# Patient Record
Sex: Female | Born: 1970 | Race: White | Hispanic: No | Marital: Single | Smoking: Current every day smoker
Health system: Southern US, Community
[De-identification: ages and names within clinical notes are randomized; demographics above are authoritative.]

## PROBLEM LIST (undated history)

## (undated) DIAGNOSIS — F419 Anxiety disorder, unspecified: Secondary | ICD-10-CM

## (undated) DIAGNOSIS — N200 Calculus of kidney: Secondary | ICD-10-CM

## (undated) HISTORY — PX: TUBAL LIGATION: SHX77

## (undated) HISTORY — PX: TONSILLECTOMY: SUR1361

---

## 1998-03-24 ENCOUNTER — Encounter: Admission: RE | Admit: 1998-03-24 | Discharge: 1998-06-22 | Payer: Self-pay | Admitting: Gynecology

## 1998-07-14 ENCOUNTER — Encounter: Admission: RE | Admit: 1998-07-14 | Discharge: 1998-10-12 | Payer: Self-pay | Admitting: Gynecology

## 1998-08-26 ENCOUNTER — Inpatient Hospital Stay (HOSPITAL_COMMUNITY): Admission: AD | Admit: 1998-08-26 | Discharge: 1998-08-29 | Payer: Self-pay | Admitting: Gynecology

## 1998-09-24 ENCOUNTER — Other Ambulatory Visit: Admission: RE | Admit: 1998-09-24 | Discharge: 1998-09-24 | Payer: Self-pay | Admitting: Obstetrics and Gynecology

## 1998-10-08 ENCOUNTER — Ambulatory Visit (HOSPITAL_COMMUNITY): Admission: RE | Admit: 1998-10-08 | Discharge: 1998-10-08 | Payer: Self-pay | Admitting: Obstetrics and Gynecology

## 2000-04-20 ENCOUNTER — Emergency Department (HOSPITAL_COMMUNITY): Admission: EM | Admit: 2000-04-20 | Discharge: 2000-04-20 | Payer: Self-pay | Admitting: Emergency Medicine

## 2002-02-03 ENCOUNTER — Encounter: Payer: Self-pay | Admitting: *Deleted

## 2002-02-03 ENCOUNTER — Ambulatory Visit (HOSPITAL_COMMUNITY): Admission: RE | Admit: 2002-02-03 | Discharge: 2002-02-03 | Payer: Self-pay | Admitting: *Deleted

## 2005-08-15 ENCOUNTER — Other Ambulatory Visit: Admission: RE | Admit: 2005-08-15 | Discharge: 2005-08-15 | Payer: Self-pay | Admitting: Family Medicine

## 2006-06-03 ENCOUNTER — Emergency Department (HOSPITAL_COMMUNITY): Admission: EM | Admit: 2006-06-03 | Discharge: 2006-06-03 | Payer: Self-pay | Admitting: Emergency Medicine

## 2008-02-07 ENCOUNTER — Other Ambulatory Visit: Admission: RE | Admit: 2008-02-07 | Discharge: 2008-02-07 | Payer: Self-pay | Admitting: Family Medicine

## 2008-08-19 ENCOUNTER — Emergency Department (HOSPITAL_COMMUNITY): Admission: EM | Admit: 2008-08-19 | Discharge: 2008-08-19 | Payer: Self-pay | Admitting: Emergency Medicine

## 2009-11-22 IMAGING — CT CT ABDOMEN W/O CM
2 of 4 series · 17 of 46 positions shown, 19 images · non-contrast
Comparison: Report of prior study 02/03/2002 is reviewed.  Images
are not available for comparison.

CT ABDOMEN

CLINICAL DATA: Right-sided flank pain

CT ABDOMEN AND PELVIS WITHOUT CONTRAST
TECHNIQUE: Multidetector CT imaging of the abdomen and pelvis was
performed following the standard
protocol without intravenous contrast.

[Series 2: 160 stone 5.0 b40f st · axial · 0.58mm/px · z∈[+857,+1162]mm · 14 of 67 slices shown, 16 images]
[im 3/67  soft-tissue]
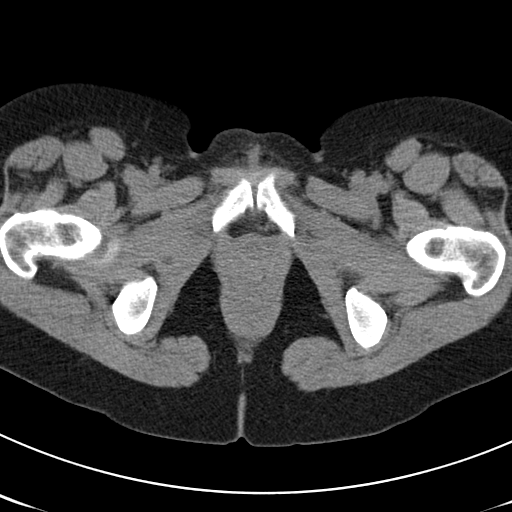
[im 3/67  bone]
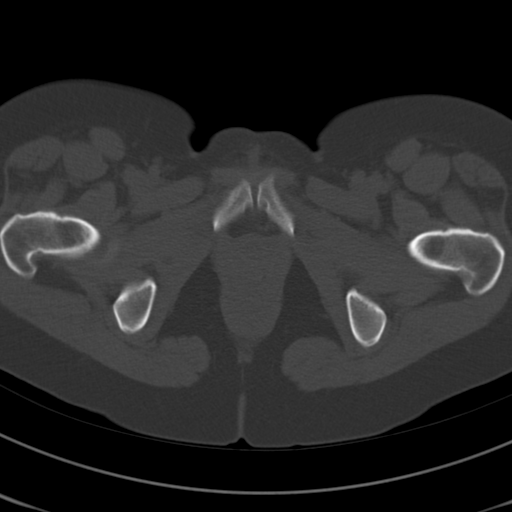
[im 8/67  soft-tissue]
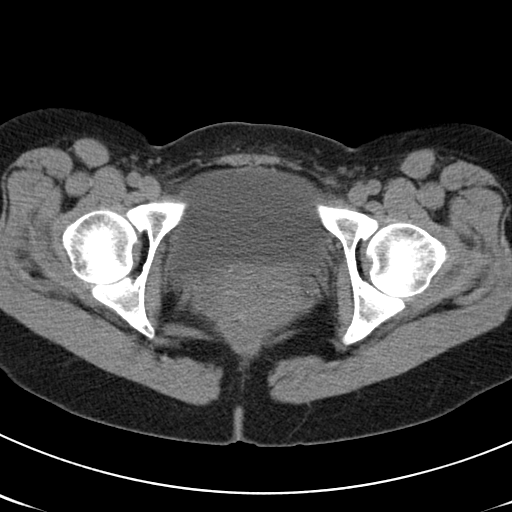
[im 12/67  soft-tissue]
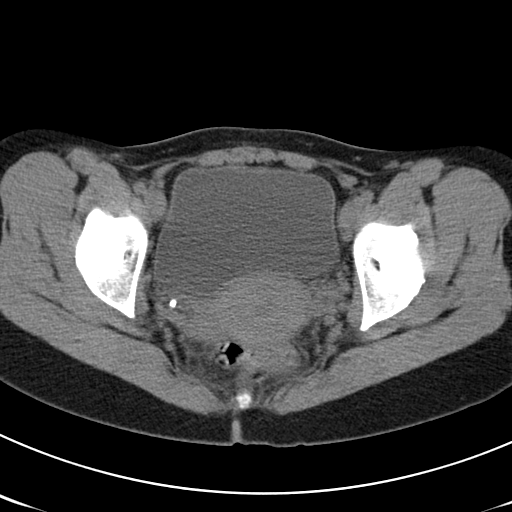
[im 17/67  soft-tissue]
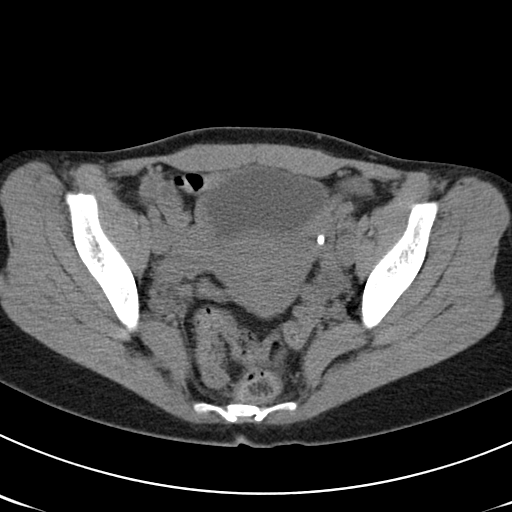
[im 22/67  soft-tissue]
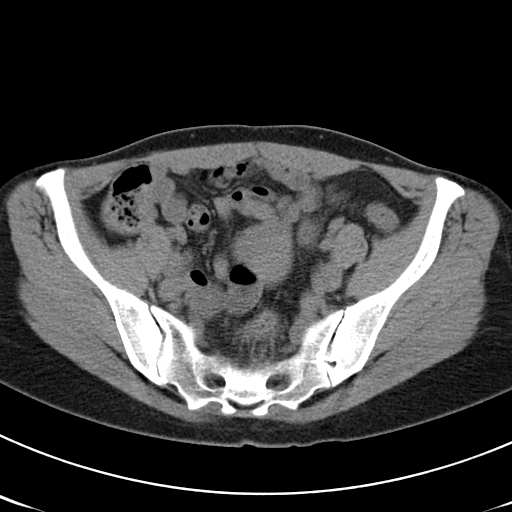
[im 26/67  soft-tissue]
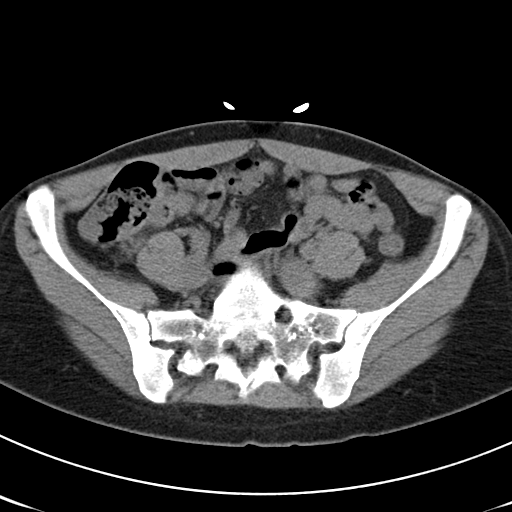
[im 31/67  soft-tissue]
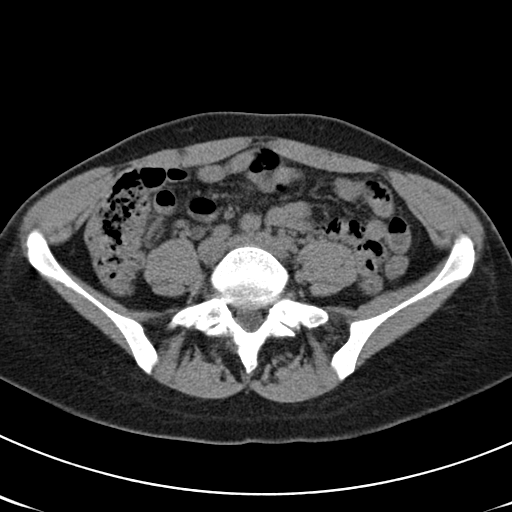
[im 36/67  soft-tissue]
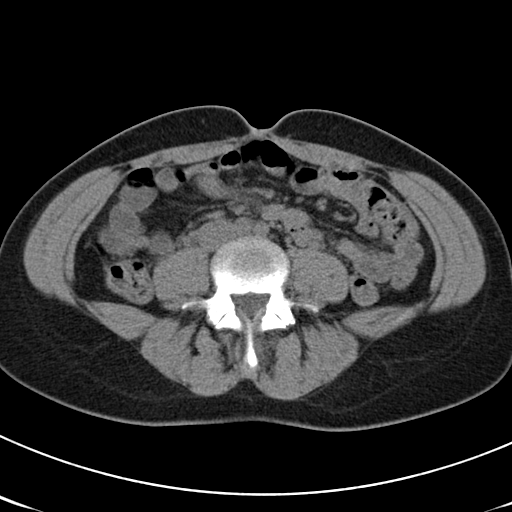
[im 41/67  soft-tissue]
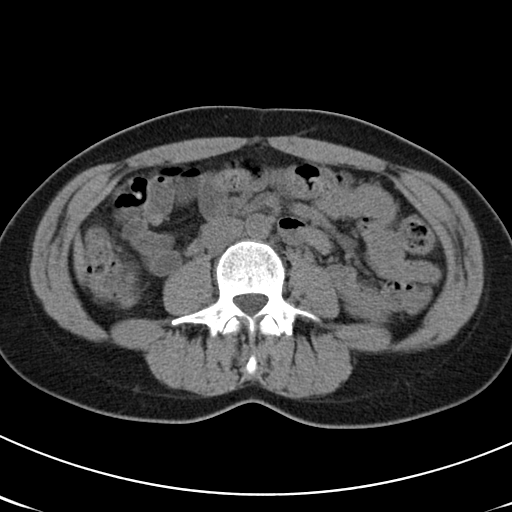
[im 41/67  bone]
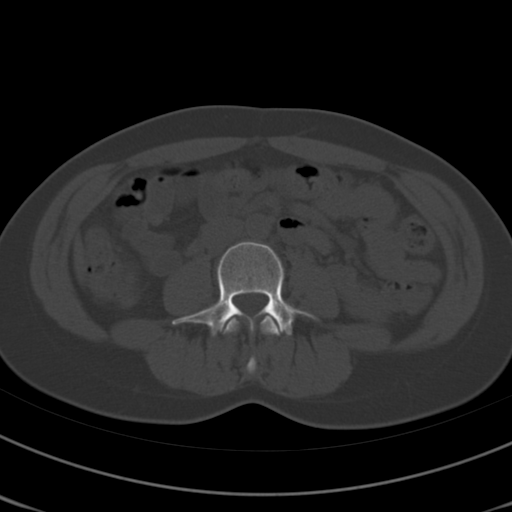
[im 45/67  soft-tissue]
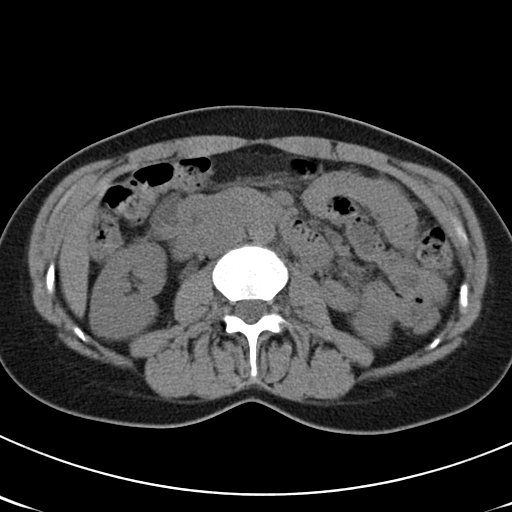
[im 50/67  soft-tissue]
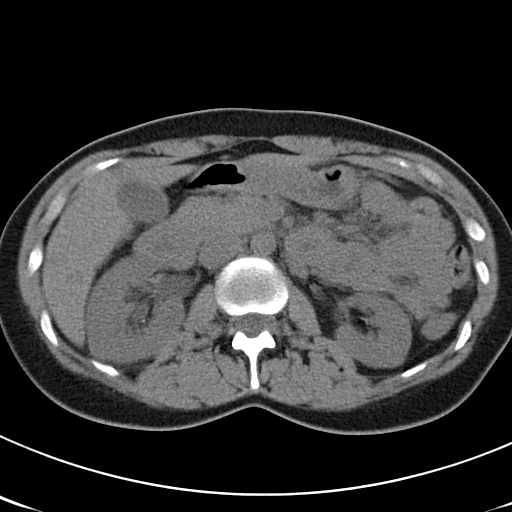
[im 55/67  soft-tissue]
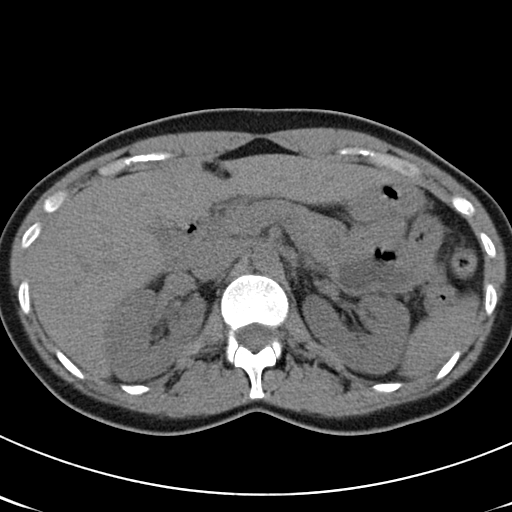
[im 59/67  soft-tissue]
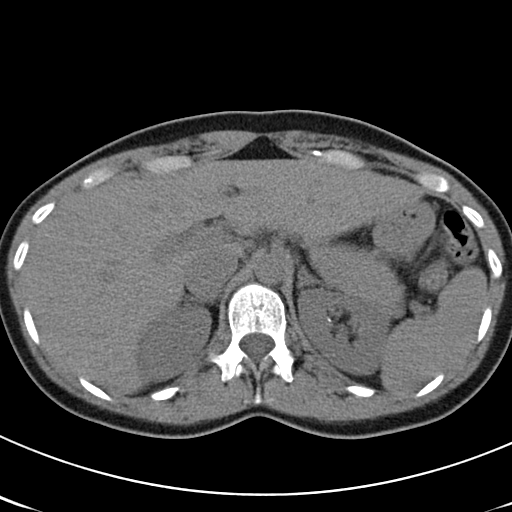
[im 64/67  soft-tissue]
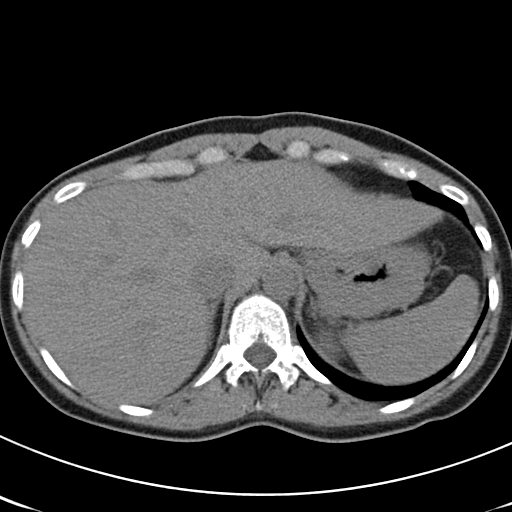

[Series 602: <mpr thick range> · coronal · 0.69mm/px · 3 of 54 slices shown]
[im 18/54  soft-tissue]
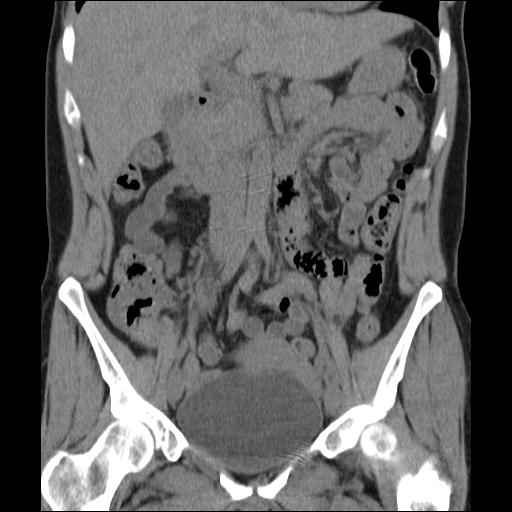
[im 24/54  soft-tissue]
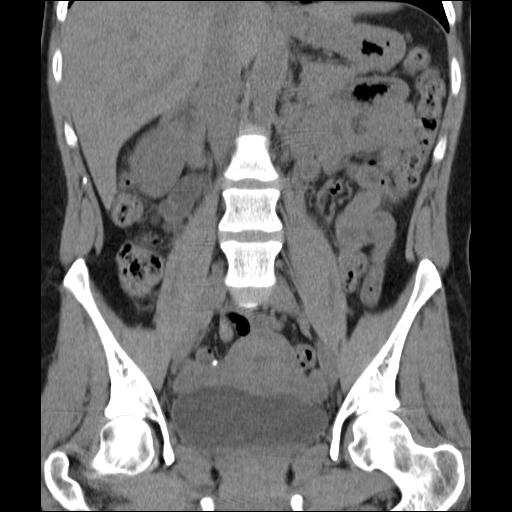
[im 30/54  soft-tissue]
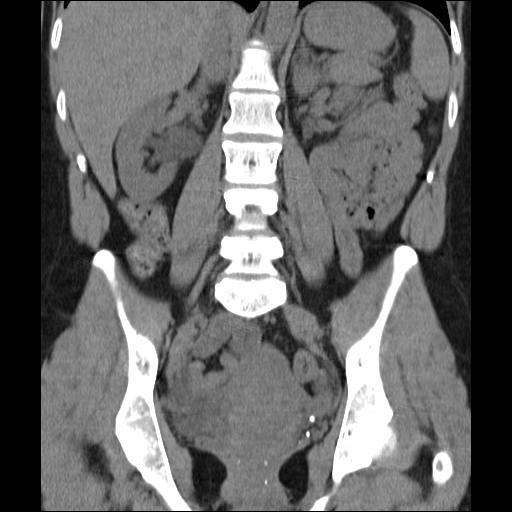

[17 of 46 positions shown; findings below may reference images not displayed]

FINDINGS: Mild right hydroureteronephrosis noted to the level of a
8 x 5 mm right ureterovesicular junction calculus.  Bilateral
nonobstructing renal calculi are otherwise noted, largest in the
left mid kidney measuring 4 mm and largest in the right upper renal
pole measuring 3 mm.  Unenhanced abdominal viscera are otherwise
unremarkable in their visualized aspects.
IMPRESSION: 8 x 5 mm right ureterovesicular junction calculus producing mild
right hydroureteronephrosis.

CT PELVIS
FINDINGS: Tubal ligation clips noted.  Uterus and ovaries
otherwise unremarkable.  The bladder is distended.  There is a 5 mm
calcific opacity that appears to lie dependently within the bladder
and may represent a recently passed stone.  Uterus and ovaries are
unremarkable.  Unopacified bowel is normal in appearance.  No acute
bony finding.
IMPRESSION: 5 mm dependent probable bladder calculus.  Volume averaging with
adjacent pelvic phlebolith is much less likely. Please also see CT
abdomen report above.

## 2011-05-19 ENCOUNTER — Ambulatory Visit
Admission: RE | Admit: 2011-05-19 | Discharge: 2011-05-19 | Disposition: A | Discharge status: Home / Self Care | Payer: Managed Care, Other (non HMO) | Source: Ambulatory Visit | Attending: Family Medicine | Admitting: Family Medicine

## 2011-05-19 ENCOUNTER — Other Ambulatory Visit: Payer: Self-pay | Admitting: Family Medicine

## 2011-05-19 DIAGNOSIS — R17 Unspecified jaundice: Secondary | ICD-10-CM

## 2011-05-21 ENCOUNTER — Inpatient Hospital Stay (HOSPITAL_COMMUNITY)
Admission: EM | Admit: 2011-05-21 | Discharge: 2011-05-27 | DRG: 442 | Disposition: A | Discharge status: Home / Self Care | Payer: Managed Care, Other (non HMO) | Attending: Family Medicine | Admitting: Family Medicine

## 2011-05-21 DIAGNOSIS — K7200 Acute and subacute hepatic failure without coma: Principal | ICD-10-CM | POA: Diagnosis present

## 2011-05-21 DIAGNOSIS — F3289 Other specified depressive episodes: Secondary | ICD-10-CM | POA: Diagnosis present

## 2011-05-21 DIAGNOSIS — E860 Dehydration: Secondary | ICD-10-CM | POA: Diagnosis present

## 2011-05-21 DIAGNOSIS — R17 Unspecified jaundice: Secondary | ICD-10-CM | POA: Diagnosis present

## 2011-05-21 DIAGNOSIS — F1910 Other psychoactive substance abuse, uncomplicated: Secondary | ICD-10-CM | POA: Diagnosis present

## 2011-05-21 DIAGNOSIS — F329 Major depressive disorder, single episode, unspecified: Secondary | ICD-10-CM | POA: Diagnosis present

## 2011-05-21 DIAGNOSIS — F17200 Nicotine dependence, unspecified, uncomplicated: Secondary | ICD-10-CM | POA: Diagnosis present

## 2011-05-21 DIAGNOSIS — D696 Thrombocytopenia, unspecified: Secondary | ICD-10-CM | POA: Diagnosis present

## 2011-05-21 LAB — DIFFERENTIAL
Basophils Absolute: 0 10*3/uL (ref 0.0–0.1)
Basophils Relative: 1 % (ref 0–1)
Eosinophils Absolute: 0.3 10*3/uL (ref 0.0–0.7)
Eosinophils Relative: 5 % (ref 0–5)
Lymphocytes Relative: 28 % (ref 12–46)
Lymphs Abs: 1.7 10*3/uL (ref 0.7–4.0)
Monocytes Absolute: 0.8 10*3/uL (ref 0.1–1.0)
Monocytes Relative: 13 % — ABNORMAL HIGH (ref 3–12)
Neutro Abs: 3.3 10*3/uL (ref 1.7–7.7)
Neutrophils Relative %: 53 % (ref 43–77)

## 2011-05-21 LAB — CBC
HCT: 42.2 % (ref 36.0–46.0)
Hemoglobin: 15.2 g/dL — ABNORMAL HIGH (ref 12.0–15.0)
MCHC: 36 g/dL (ref 30.0–36.0)
Platelets: 147 10*3/uL — ABNORMAL LOW (ref 150–400)
WBC: 6.2 10*3/uL (ref 4.0–10.5)

## 2011-05-21 LAB — HEPATIC FUNCTION PANEL
AST: 1452 U/L — ABNORMAL HIGH (ref 0–37)
Bilirubin, Direct: 10.2 mg/dL — ABNORMAL HIGH (ref 0.0–0.3)
Indirect Bilirubin: 4.5 mg/dL — ABNORMAL HIGH (ref 0.3–0.9)
Total Bilirubin: 14.7 mg/dL — ABNORMAL HIGH (ref 0.3–1.2)

## 2011-05-21 LAB — URINALYSIS, ROUTINE W REFLEX MICROSCOPIC
Nitrite: NEGATIVE
Protein, ur: NEGATIVE mg/dL
Specific Gravity, Urine: 1.015 (ref 1.005–1.030)
Urobilinogen, UA: 1 mg/dL (ref 0.0–1.0)

## 2011-05-21 LAB — PROTIME-INR: Prothrombin Time: 15.6 seconds — ABNORMAL HIGH (ref 11.6–15.2)

## 2011-05-21 LAB — POCT PREGNANCY, URINE: Preg Test, Ur: NEGATIVE

## 2011-05-21 LAB — BASIC METABOLIC PANEL
BUN: 10 mg/dL (ref 6–23)
Chloride: 100 mEq/L (ref 96–112)
Potassium: 3.7 mEq/L (ref 3.5–5.1)
Sodium: 136 mEq/L (ref 135–145)

## 2011-05-21 LAB — SALICYLATE LEVEL: Salicylate Lvl: <2 mg/dL — ABNORMAL LOW (ref 2.8–20.0)

## 2011-05-21 LAB — URINE MICROSCOPIC-ADD ON

## 2011-05-21 LAB — LIPASE, BLOOD: Lipase: 55 U/L (ref 11–59)

## 2011-05-21 LAB — ACETAMINOPHEN LEVEL: Acetaminophen (Tylenol), Serum: <15 ug/mL (ref 10–30)

## 2011-05-22 LAB — HEPATIC FUNCTION PANEL
ALT: 1181 U/L — ABNORMAL HIGH (ref 0–35)
ALT: 1299 U/L — ABNORMAL HIGH (ref 0–35)
AST: 1030 U/L — ABNORMAL HIGH (ref 0–37)
Albumin: 2.6 g/dL — ABNORMAL LOW (ref 3.5–5.2)
Alkaline Phosphatase: 100 U/L (ref 39–117)
Bilirubin, Direct: 7.7 mg/dL — ABNORMAL HIGH (ref 0.0–0.3)
Indirect Bilirubin: 3.9 mg/dL — ABNORMAL HIGH (ref 0.3–0.9)
Indirect Bilirubin: 4.3 mg/dL — ABNORMAL HIGH (ref 0.3–0.9)
Total Protein: 5.1 g/dL — ABNORMAL LOW (ref 6.0–8.3)

## 2011-05-22 LAB — BASIC METABOLIC PANEL
Calcium: 8.2 mg/dL — ABNORMAL LOW (ref 8.4–10.5)
Creatinine, Ser: <0.47 mg/dL — ABNORMAL LOW (ref 0.50–1.10)
Sodium: 139 mEq/L (ref 135–145)

## 2011-05-22 LAB — AMMONIA: Ammonia: <10 umol/L — ABNORMAL LOW (ref 11–60)

## 2011-05-22 LAB — CBC
Hemoglobin: 12.1 g/dL (ref 12.0–15.0)
MCH: 29.8 pg (ref 26.0–34.0)
MCHC: 35.7 g/dL (ref 30.0–36.0)
MCV: 83.5 fL (ref 78.0–100.0)
RBC: 4.06 MIL/uL (ref 3.87–5.11)

## 2011-05-22 LAB — TSH: TSH: 1.057 u[IU]/mL (ref 0.350–4.500)

## 2011-05-22 LAB — DRUGS OF ABUSE SCREEN W/O ALC, ROUTINE URINE
Amphetamine Screen, Ur: NEGATIVE
Barbiturate Quant, Ur: NEGATIVE
Cocaine Metabolites: NEGATIVE
Creatinine,U: 123.5 mg/dL
Marijuana Metabolite: NEGATIVE

## 2011-05-22 LAB — DIFFERENTIAL
Eosinophils Absolute: 0.4 10*3/uL (ref 0.0–0.7)
Lymphs Abs: 1.7 10*3/uL (ref 0.7–4.0)
Monocytes Absolute: 0.7 10*3/uL (ref 0.1–1.0)
Monocytes Relative: 13 % — ABNORMAL HIGH (ref 3–12)
Neutro Abs: 2.7 10*3/uL (ref 1.7–7.7)
Neutrophils Relative %: 48 % (ref 43–77)

## 2011-05-22 LAB — LACTIC ACID, PLASMA: Lactic Acid, Venous: 1.2 mmol/L (ref 0.5–2.2)

## 2011-05-22 LAB — LIPID PANEL
Cholesterol: 117 mg/dL (ref 0–200)
Triglycerides: 111 mg/dL (ref <150)

## 2011-05-22 LAB — PROCALCITONIN: Procalcitonin: 0.17 ng/mL

## 2011-05-22 LAB — IRON: Iron: 249 ug/dL — ABNORMAL HIGH (ref 42–135)

## 2011-05-22 LAB — HIV ANTIBODY (ROUTINE TESTING W REFLEX): HIV: NONREACTIVE

## 2011-05-23 LAB — COMPREHENSIVE METABOLIC PANEL
AST: 869 U/L — ABNORMAL HIGH (ref 0–37)
CO2: 25 mEq/L (ref 19–32)
Calcium: 8 mg/dL — ABNORMAL LOW (ref 8.4–10.5)
Creatinine, Ser: <0.47 mg/dL — ABNORMAL LOW (ref 0.50–1.10)
Glucose, Bld: 68 mg/dL — ABNORMAL LOW (ref 70–99)

## 2011-05-23 LAB — CBC
HCT: 34.7 % — ABNORMAL LOW (ref 36.0–46.0)
Hemoglobin: 12.1 g/dL (ref 12.0–15.0)
RDW: 15.6 % — ABNORMAL HIGH (ref 11.5–15.5)
WBC: 5.8 10*3/uL (ref 4.0–10.5)

## 2011-05-23 LAB — URINE CULTURE: Culture  Setup Time: 201206181356

## 2011-05-23 LAB — CERULOPLASMIN: Ceruloplasmin: 20 mg/dL — ABNORMAL LOW (ref 20–60)

## 2011-05-24 LAB — PROTEIN ELECTROPH W RFLX QUANT IMMUNOGLOBULINS
Albumin ELP: 57.4 % (ref 55.8–66.1)
Alpha-1-Globulin: 4.2 % (ref 2.9–4.9)
Beta 2: 3 % — ABNORMAL LOW (ref 3.2–6.5)
Beta Globulin: 6.6 % (ref 4.7–7.2)

## 2011-05-24 LAB — HEPATITIS PANEL, ACUTE
HCV Ab: NEGATIVE
Hep A IgM: NEGATIVE
Hepatitis B Surface Ag: NEGATIVE

## 2011-05-24 LAB — COMPREHENSIVE METABOLIC PANEL
AST: 783 U/L — ABNORMAL HIGH (ref 0–37)
Albumin: 2.4 g/dL — ABNORMAL LOW (ref 3.5–5.2)
Calcium: 8 mg/dL — ABNORMAL LOW (ref 8.4–10.5)
Creatinine, Ser: <0.47 mg/dL — ABNORMAL LOW (ref 0.50–1.10)
Total Protein: 5 g/dL — ABNORMAL LOW (ref 6.0–8.3)

## 2011-05-24 LAB — PROTEIN ELECTROPHORESIS, SERUM
Albumin ELP: 58.4 % (ref 55.8–66.1)
Alpha-1-Globulin: 4.2 % (ref 2.9–4.9)
Alpha-2-Globulin: 8.6 % (ref 7.1–11.8)
Beta 2: 3 % — ABNORMAL LOW (ref 3.2–6.5)
Gamma Globulin: 19.7 % — ABNORMAL HIGH (ref 11.1–18.8)

## 2011-05-24 LAB — PROTIME-INR
INR: 1.25 (ref 0.00–1.49)
Prothrombin Time: 16 seconds — ABNORMAL HIGH (ref 11.6–15.2)

## 2011-05-24 LAB — MITOCHONDRIAL ANTIBODIES: Mitochondrial M2 Ab, IgG: 0.4 (ref <0.91)

## 2011-05-24 LAB — ANTI-SMOOTH MUSCLE ANTIBODY, IGG: F-Actin IgG: 14 U (ref <20)

## 2011-05-24 LAB — APTT: aPTT: 35 seconds (ref 24–37)

## 2011-05-25 ENCOUNTER — Inpatient Hospital Stay (HOSPITAL_COMMUNITY): Payer: Managed Care, Other (non HMO)

## 2011-05-25 ENCOUNTER — Other Ambulatory Visit: Payer: Self-pay | Admitting: Interventional Radiology

## 2011-05-25 LAB — COMPREHENSIVE METABOLIC PANEL
ALT: 822 U/L — ABNORMAL HIGH (ref 0–35)
Calcium: 7.6 mg/dL — ABNORMAL LOW (ref 8.4–10.5)
Creatinine, Ser: <0.47 mg/dL — ABNORMAL LOW (ref 0.50–1.10)
Glucose, Bld: 71 mg/dL (ref 70–99)
Sodium: 138 mEq/L (ref 135–145)
Total Protein: 4.3 g/dL — ABNORMAL LOW (ref 6.0–8.3)

## 2011-05-25 LAB — CBC
HCT: 34.2 % — ABNORMAL LOW (ref 36.0–46.0)
MCHC: 35.7 g/dL (ref 30.0–36.0)
Platelets: 112 10*3/uL — ABNORMAL LOW (ref 150–400)
RDW: 16.3 % — ABNORMAL HIGH (ref 11.5–15.5)
WBC: 6.7 10*3/uL (ref 4.0–10.5)

## 2011-05-25 LAB — IGG, IGA, IGM
IgA: 138 mg/dL (ref 69–380)
IgM, Serum: 116 mg/dL (ref 52–322)

## 2011-05-26 LAB — COMPREHENSIVE METABOLIC PANEL
ALT: 811 U/L — ABNORMAL HIGH (ref 0–35)
AST: 531 U/L — ABNORMAL HIGH (ref 0–37)
Albumin: 2.3 g/dL — ABNORMAL LOW (ref 3.5–5.2)
Alkaline Phosphatase: 115 U/L (ref 39–117)
Chloride: 105 mEq/L (ref 96–112)
Potassium: 3.5 mEq/L (ref 3.5–5.1)
Sodium: 137 mEq/L (ref 135–145)
Total Bilirubin: 12.9 mg/dL — ABNORMAL HIGH (ref 0.3–1.2)
Total Protein: 4.7 g/dL — ABNORMAL LOW (ref 6.0–8.3)

## 2011-05-27 LAB — GLUCOSE, CAPILLARY: Glucose-Capillary: 76 mg/dL (ref 70–99)

## 2011-05-27 LAB — COMPREHENSIVE METABOLIC PANEL
ALT: 716 U/L — ABNORMAL HIGH (ref 0–35)
BUN: 6 mg/dL (ref 6–23)
CO2: 25 mEq/L (ref 19–32)
Calcium: 8.1 mg/dL — ABNORMAL LOW (ref 8.4–10.5)
Glucose, Bld: 72 mg/dL (ref 70–99)
Total Protein: 4.9 g/dL — ABNORMAL LOW (ref 6.0–8.3)

## 2011-05-29 NOTE — H&P (Signed)
Betty Cannon, Betty Cannon                  ACCOUNT NO.:  [id number]  MEDICAL RECORD NO.:  [id number]  LOCATION:  [facility]                         FACILITY:  [facility]  PHYSICIAN:  Eduard Clos, MDDATE OF BIRTH:  [birth year]  DATE OF ADMISSION:  05/21/2011 DATE OF DISCHARGE:                             HISTORY & PHYSICAL   PRIMARY CARE PHYSICIAN:  At [facility], Dr. Brendia Sacks.  CHIEF COMPLAINT:  Abnormal labs.  HISTORY OF PRESENT ILLNESS:  This 40 year old female with history of depression on Zoloft for the last 4 months has been experiencing some nausea, poor appetite over the last 1 week and had gone to her PCP Thursday last, that is 3 days ago, wherein labs showed that the patient had elevated LFTs.  LFTs done on May 18, 2011, showed an ALT and ALT of 1030 and 1378 with alkaline phosphatase of 100, total bilirubin was 8.1, albumin was 3.6.  She was called back on May 19, 2011, and a repeat LFT was done which showed AST of 1590, ALT of 1670, bilirubin was 11.3, albumin 3.8.  The patient eventually had other labs done and include an amylase of 37.  A sonogram of the abdomen which showed diffuse hepatic steatosis versus hepatocellular disease.  No focal hepatic parenchymal abnormality.  Normal-appearing gallbladder and no biliary ductal dilatation.  The patient's acute hepatitis panel showed hepatitis A antibody IgM was negative, hepatitis B surface antigen screen negative, hepatitis B core antibody IgM was negative, and hepatitis C virus antibody was less than 0.1.  The patient has also had lipase level which was 38 and ANA direct was negative, this was on May 19, 2011.  EBV antibody VCA IgM was 0.7, normal was 0.0-0.8 and EBV early antigen antibody IgG was 0.2, normal was 0.0-0.8, and the patient's EBV antibody VCA IgG was more than 8, normal was up to 0.8 and EBV nuclear antigen antibody IgG was more than 8, normal was up to 0.8.  The patient today felt still nauseated and felt  dizzy and weak and called the primary care physician who told her to go to ER as they felt that the patient may be in addition dehydrated.  In the ER, the patient again had LFTs done which showed increasing bilirubin at 14.7 with total bilirubin being 10.2.  PT/INR was read as 15.6 and 1.2, albumin was 3.3. At this time, the patient has been admitted for further workup of her worsening LFTs and progressive jaundice.  The patient denies any chest pain, shortness of breath, denies any fever, chills, cough, phlegm.  Did have some mild abdominal pain in the lower quadrant which has resolved, it only lasted for a few minutes. Denies any dysuria, discharge, or diarrhea.  Denies any loss of function or any focal deficit.  The patient denies having traveled anywhere outside, denies drinking alcohol recently, the last drink she had was a month ago.  Denies using Tylenol recently, the last time she used Tylenol was a month ago. Denies using any herbal medications.  She did not have any rash or did not have any insect bite.  Denies any IV drug abuse.  PAST MEDICAL HISTORY:  History of  depression.  PAST SURGICAL HISTORY:  Has had cesarean section and tubal ligation.  MEDICATIONS PRIOR TO ADMISSION:  The patient takes Zoloft for the last one mouth, dose is not known.  ALLERGIES:  Peanut.  FAMILY HISTORY:  Not known as the patient was adopted.  SOCIAL HISTORY:  The patient lives with her daughters.  Smokes cigarettes, has been advised to quit smoking.  Drinks alcohol very occasionally, last drink was a month ago.  Has used marijuana before but not in the last few months.  Denies any IV drug abuse.  REVIEW OF SYSTEMS:  As per the history of presenting illness, nothing else significant.  PHYSICAL EXAMINATION:  GENERAL:  The patient was examined at bedside, not in acute distress. VITAL SIGNS:  Blood pressure is 94/66, pulse 64 per minute, temperature 97.3, respirations 18 per minute, O2 sat  98%. HEENT:  Icterus present.  No pallor.  No facial asymmetry.  Tongue is midline. NECK:  No neck rigidity. CHEST:  Bilateral air entry present.  No rhonchi, no crepitation. HEART:  S1 and S2 heard. ABDOMEN:  Soft, nontender.  Bowel sounds heard. CENTRAL NERVOUS SYSTEM:  The patient is alert, awake, oriented to time, place, and person, moves upper and lower extremities 5/5. EXTREMITIES:  Peripheral pulses felt.  No edema.  LABORATORY DATA:  CBC:  WBC is 6.2, hemoglobin is 15.2, hematocrit is 42.2, platelets 147, neutrophils 53%, monocytes 13%.  PT and INR are 15.6 and 1.2, PTT is 35.  Complete metabolic panel:  Sodium 136, potassium 3.7, chloride 100, carbon dioxide 27, glucose of 71, BUN 10, creatinine 0.4.  Total bilirubin is 14.7, direct is 10.2, indirect is 4.5, alkaline phosphatase 129, AST 1452, ALT is 1689, total protein 6.5, albumin 3.3, calcium 9.2, lipase 55.  Pregnancy screen is negative.  UA appears orange, cloudy, small amount of leukocytes, squamous cells few, granular casts, wbc's 3-6, rbc's 3-6.  The labs done as an outpatient through [facility] shows amylase of 37.  Sonogram imaging done shows diffuse hepatic steatosis versus hepatocellular disease.  No focal hepatic parenchymal abnormality, normal-appearing gallbladder, and no biliary ductal dilation, 3 nonobstructing calculi in the left kidney, normal abdominal ultrasound, otherwise.  EBV antibody VCA IgM was 0.7, normal is up to 0.8, EBV early antigen antibody IgG 0.2, normal is up to 0.8, EBV antibody VCA IgG more than 8, normal is up to 0.8, EBV nuclear antigen antibody IgG more than 8, normal is up to 0.8, ANA direct is negative.  The lipase is 38.  Hepatitis A antibody IgM is negative and hepatitis B surface antigen screen negative, hepatitis B core antibody IgM negative, hepatitis C virus antibody less than 0.1.  ASSESSMENT: 1. Acute hepatitis. 2. Jaundice. 3. Tobacco abuse. 4. Thrombocytopenia. 5.  Dehydration.  PLAN: 1. At this time, I will admit the patient to medical floor. 2. At this time, I have discussed with Dr. Evette Cristal, gastroenterologist.     We are going to repeat the labs again in a.m., BMET, LFTs, PT/INR,     CBC and we are also going to get serum ferritin levels, serum iron     levels, alpha-1 antitrypsin levels, serum protein electrophoresis,     antimitochondrial antibodies, ceruloplasmin level.  A stat Tylenol     level has been already ordered.  We are also going to check ammonia     levels.  I am going to     aggressively hydrate the patient at this time and normal saline at  150 mL/hour and further recommendation will be based on the test     order, clinical course, and repeat labs in a.m., and     Gastroenterology's recommendation.     Eduard Clos, MD     ANK/MEDQ  D:  05/21/2011  T:  05/22/2011  Job:  [id number]  cc:   Dr. Brendia Sacks at [facility]  Electronically Signed by Midge Minium MD on 05/29/2011 07:39:05 AM

## 2011-05-30 ENCOUNTER — Encounter (HOSPITAL_COMMUNITY): Payer: Self-pay | Admitting: Anatomic Pathology & Clinical Pathology

## 2011-06-21 NOTE — Consult Note (Signed)
  Betty Cannon, Betty Cannon                  ACCOUNT NO.:  [id number]  MEDICAL RECORD NO.:  [id number]  LOCATION:                                 FACILITY:  PHYSICIAN:  Ghazi Rumpf C. [name], M.D.    DATE OF BIRTH:  04-Nov-1971  DATE OF CONSULTATION:  05/22/2011 DATE OF DISCHARGE:                                CONSULTATION   REASON FOR CONSULTATION:  Jaundice and malaise.  HISTORY OF PRESENT ILLNESS:  The patient is a 40 year old white female who developed anorexia and dizziness and was found to have elevated liver function tests on June 14 with an ALT of 1030 and AST 1378 with bilirubin of 8.1.  Repeat labs today show AST 976, ALT 1181, bilirubin 11.6, alkaline phosphatase 100 which are down slightly from yesterday's. She denies any recent travel, IV drug use, significant alcohol, excessive Tylenol use, transfusion history, or any other high risk behaviors.  She had hepatitis A, B, C as well as Epstein-Barr titers which were reportedly negative.  She had an abdominal ultrasound which showed no gallstones, normal gallbladder and bile duct with diffusely increased echotexture of the liver without any focal abnormality.  She has had some mild nausea and mild abdominal pain.  PAST MEDICAL HISTORY:  Depression.  MEDICATIONS:  Zoloft.  ALLERGIES:  PEANUTS.  SURGERIES:  C-section, tubal ligation.  SOCIAL HISTORY:  Drinks alcohol very rarely, was using marijuana before, does smoke cigarettes.  PHYSICAL EXAMINATION:  GENERAL:  Overtly jaundiced, alert, oriented, no acute distress. HEART:  Regular rate and rhythm without murmurs. LUNGS:  Clear. ABDOMEN:  Soft, nondistended with normoactive bowel sounds.  No hepatosplenomegaly, mass, or guarding.  LABORATORY DATA:  Ammonia level less than 10.  Salicylate level undetectable.  Lactic acidosis normal.  Acetaminophen undetectable.  PT 1.42, INR 17.6.  IMPRESSION:  Acute hepatitis like picture without any viral agents incriminated by labs to date.   No obvious hepatobiliary abnormality.  PLAN:  Her liver function tests hopefully have peaked and if they return to normal, may not need any specific therapy.  We will go ahead and order autoimmune markers for possibility of autoimmune hepatitis requiring immunosuppression.  We will follow with you.          ______________________________ Everardo All. [name], M.D.     JCH/MEDQ  D:  05/22/2011  T:  05/22/2011  Job:  [id number]  Electronically Signed by Dorena Cookey M.D. on 06/21/2011 07:02:51 PM

## 2011-06-21 NOTE — Discharge Summary (Signed)
NAMECHRISTYNA, LETENDRE                  ACCOUNT NO.:  [id number]  MEDICAL RECORD NO.:  [id number]  LOCATION:  5508                         FACILITY:  [facility]  PHYSICIAN:  Mauro Kaufmann, MD         DATE OF BIRTH:  1971-03-19  DATE OF ADMISSION:  05/21/2011 DATE OF DISCHARGE:  05/27/2011                              DISCHARGE SUMMARY   ADMISSION DIAGNOSES: 1. Acute hepatitis. 2. Jaundice. 3. Drug abuse. 4. Thrombocytopenia. 5. Dehydration.  DISCHARGE DIAGNOSES:  Include 1. Acute hepatitis, resolving. 2. Jaundice, resolving. 3. Tobacco abuse. 4. Depression.  TESTS PERFORMED DURING THE HOSPITAL STAY:  Include abdominal ultrasound on May 19, 2011, showed diffuse hepatic steatosis versus hepatocellular disease, no focal hepatic parenchymal abnormality, normal appearing gallbladder, no biliary duct dilatation, 3 nonobstructing calculi in the left kidney, normal abdominal ultrasound otherwise.  Liver biopsies done on May 25, 2011, was ultrasound guided core biopsy, results are pending at this time.  BRIEF HISTORY AND PHYSICAL:  This is a 40 year old female who developed anorexia and dizziness, who was found to have elevated LFTs as of June 14.  The patient's repeat labs today showed AST of 976 with ALT of 1181, bilirubin 11.6, alkaline phosphatase 400.  The patient had seen her primary care provider.  She had hepatitis A, B, C as well as Epstein-Barr virus status which were all negative.  Abdominal ultrasound showed hepatocellular disease, so the patient was admitted for further evaluation.  BRIEF HOSPITAL COURSE: 1. Hepatitis.  The patient was seen by GI and LFTs were followed     serially.  LFTs did not improve as expected, so liver biopsy was     done and the biopsy results are pending at this time.  The     patient's AST was 976 and ALT was 1181 on June 18th, as of June     23rd, the AST is 513 with ALT of 716.  Alk phos had down to 119.     The patient had serum marker studies  which were all negative.     Serum protein electrophoresis showed no M-spike.  Hepatitis A, B, C     antibodies are negative.  Hepatitis B core antibody also was     negative.  All the drug screens were negative.  At this time, the     patient will follow up with Dr. [name] as an outpatient in 1-2 weeks     to follow up with the biopsy results.  The patient tells that 3     weeks which she took amoxicillin and we are not sure if that is the     cause of the hepatitis and which the patient has been told to stay     away from amoxicillin in future.  As no definite cause of hepatitis     has been found yet.  Further recommendations as per GI as     outpatient.  MEDICATION ON DISCHARGE:  Include 1. Oxycodone 5 mg by mouth every 6 hours as needed. 2. Zoloft 25 mg p.o. daily.     Mauro Kaufmann, MD     GL/MEDQ  D:  05/27/2011  T:  05/28/2011  Job:  [id number]  cc:   Dr. Velna Ochs C. [name], M.D.  Electronically Signed by Mauro Kaufmann  on 06/21/2011 08:05:29 PM

## 2011-09-04 LAB — URINALYSIS, ROUTINE W REFLEX MICROSCOPIC
Bilirubin Urine: NEGATIVE
Glucose, UA: NEGATIVE
Ketones, ur: NEGATIVE
Leukocytes, UA: NEGATIVE
Nitrite: NEGATIVE
Protein, ur: NEGATIVE

## 2011-09-04 LAB — POCT PREGNANCY, URINE: Preg Test, Ur: NEGATIVE

## 2012-08-21 IMAGING — US US ABDOMEN COMPLETE
1 series · 13 of 25 positions shown · non-contrast
Comparison: CT urogram 08/19/2008.

CLINICAL DATA: Jaundice.  Elevated liver function tests.  Mid
abdominal pain.  History of urinary tract calculi.

COMPLETE ABDOMINAL ULTRASOUND 05/19/2011:

[Series 1: us abdomen complete · 0.22mm/px · 13 of 88 slices shown]
[im 1/88]
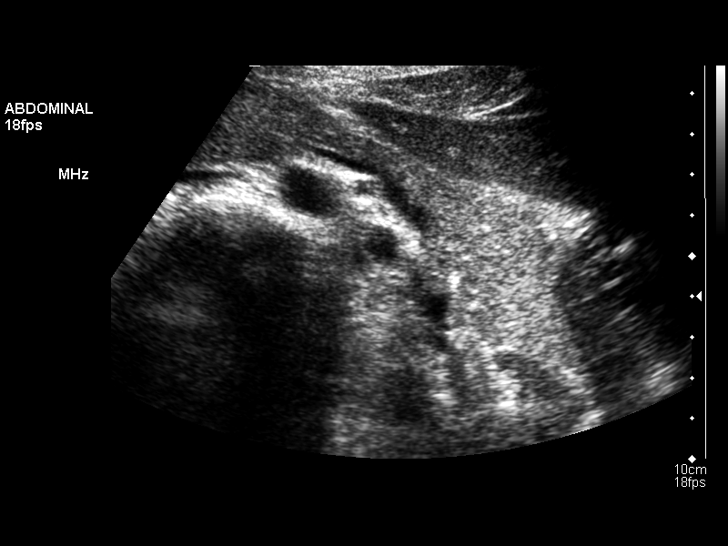
[im 8/88]
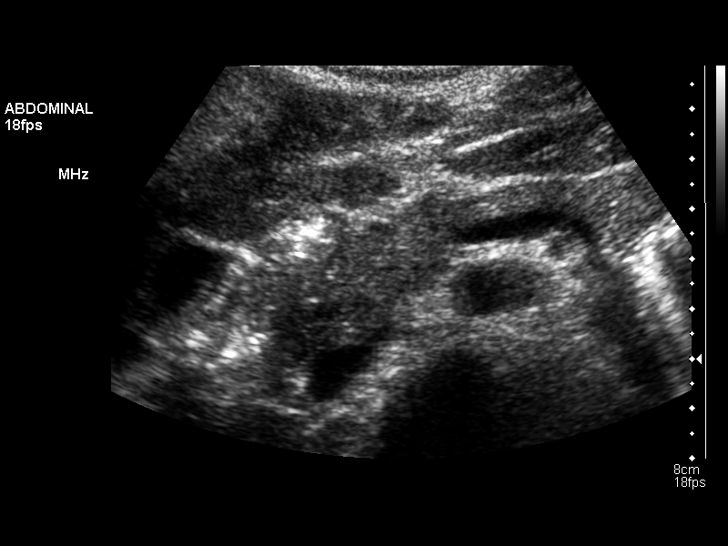
[im 15/88]
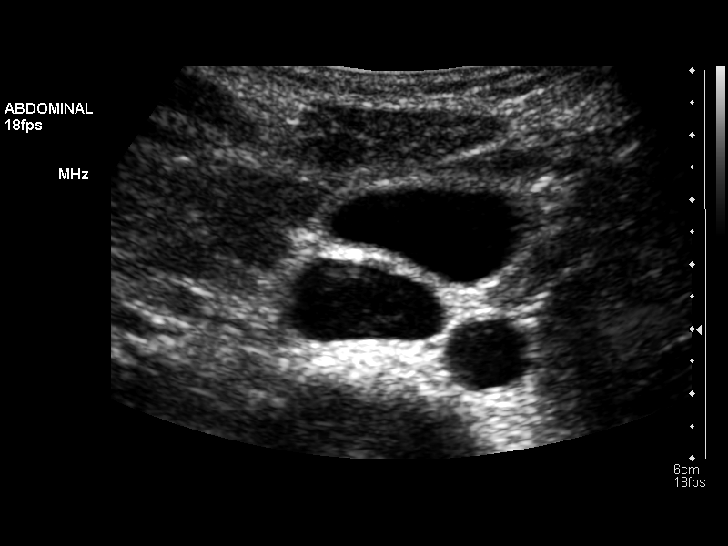
[im 22/88]
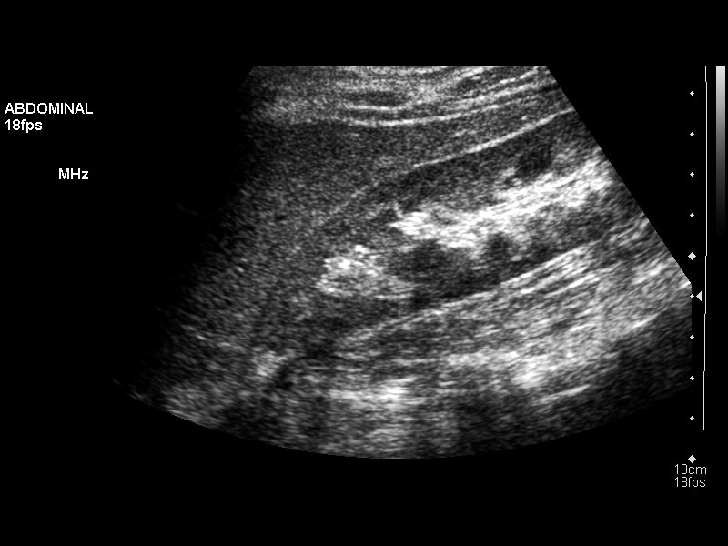
[im 30/88]
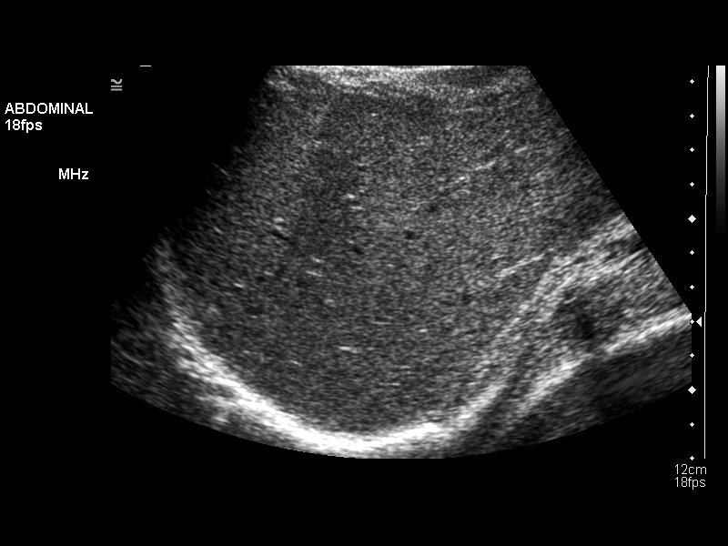
[im 37/88]
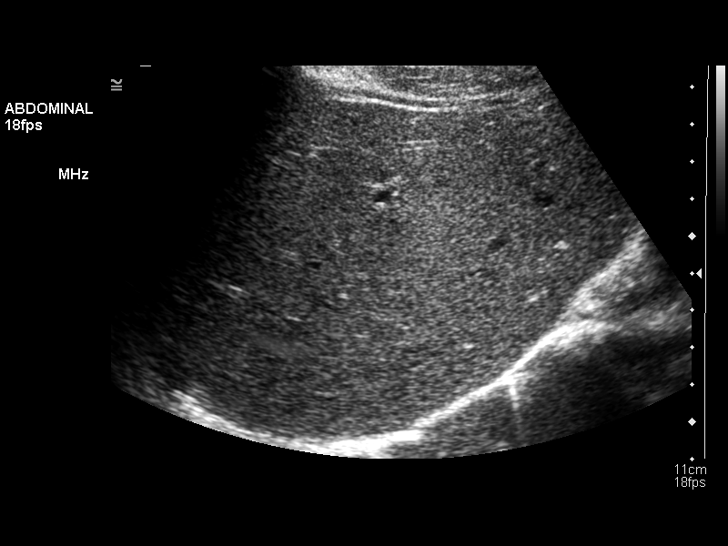
[im 44/88]
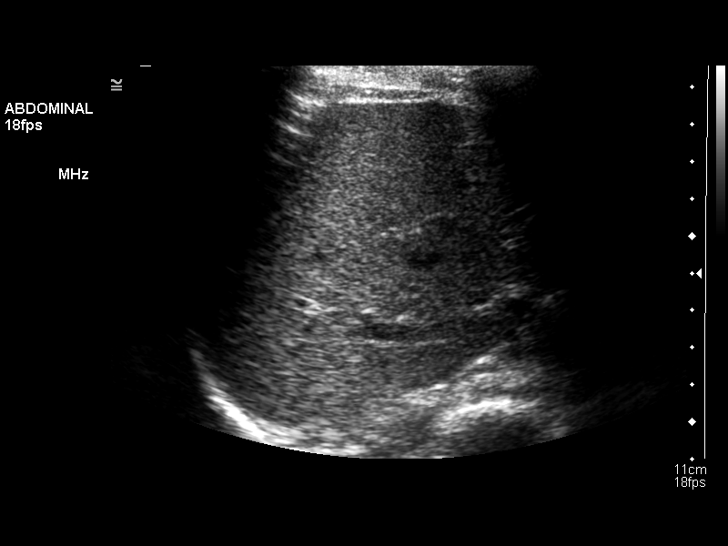
[im 51/88]
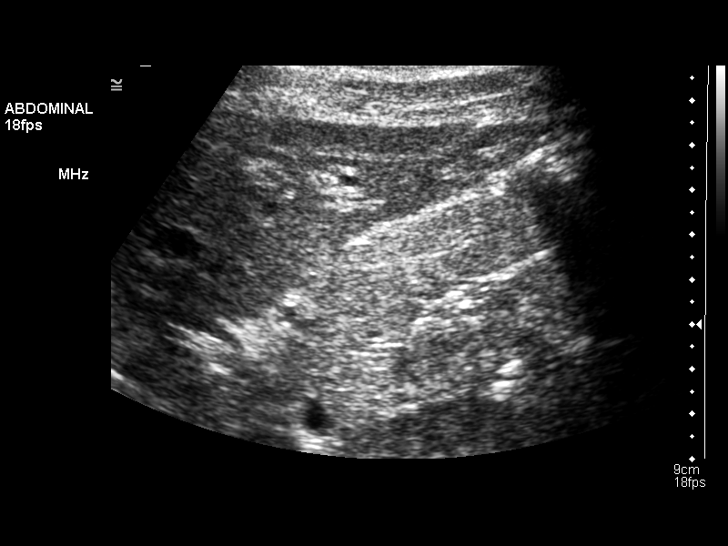
[im 59/88]
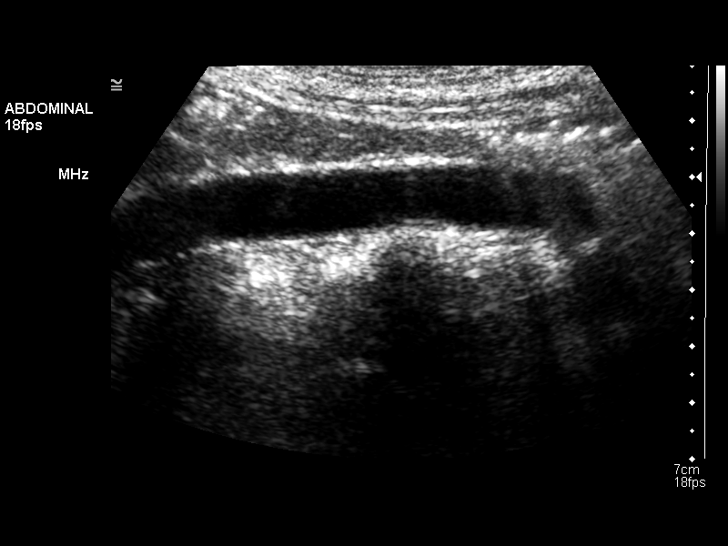
[im 66/88]
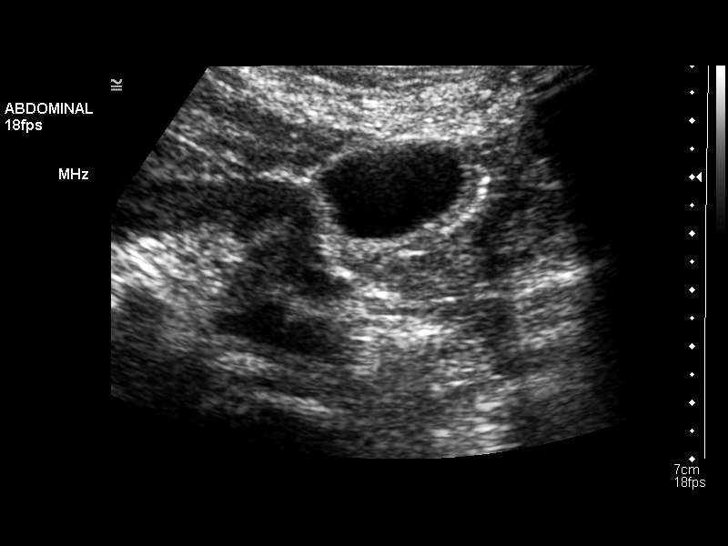
[im 73/88]
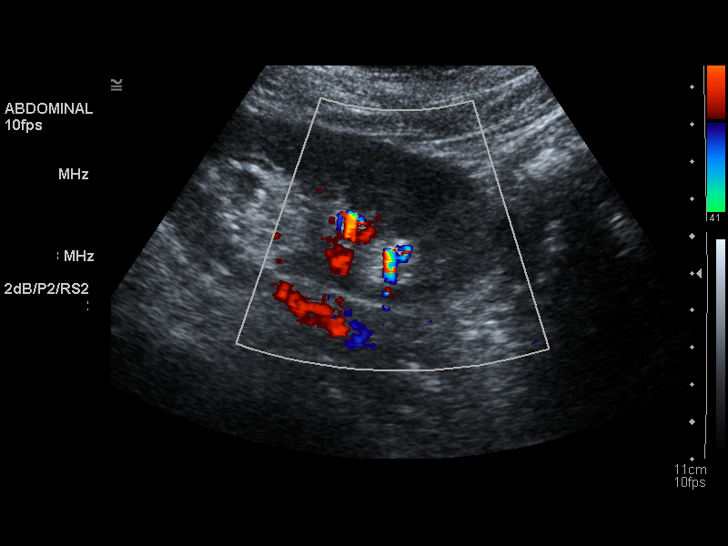
[im 80/88]
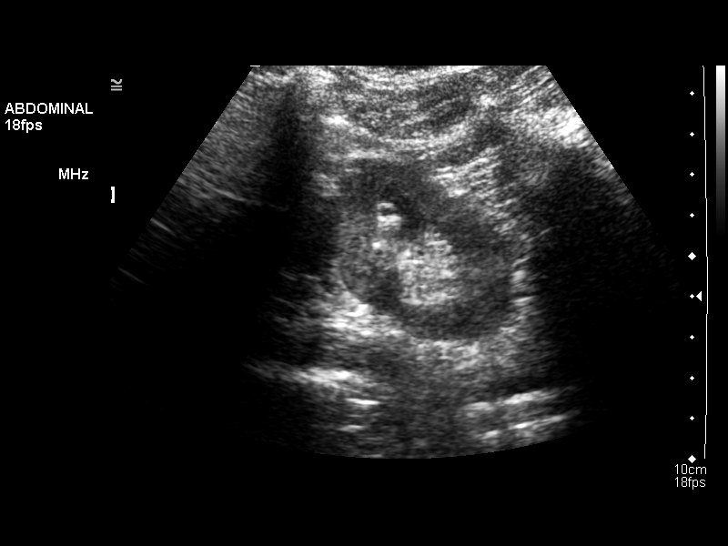
[im 88/88]
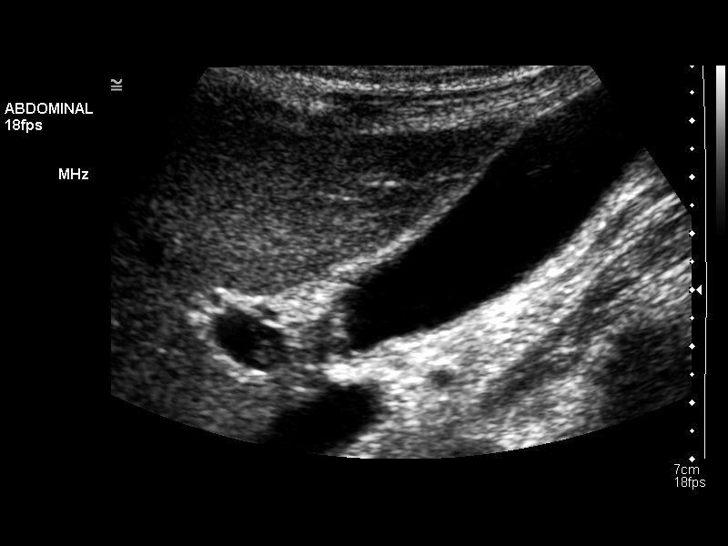

[13 of 25 positions shown; findings below may reference images not displayed]

FINDINGS: Gallbladder:  No shadowing gallstones or echogenic sludge.  No
gallbladder wall thickening or pericholecystic fluid.  Negative
sonographic Murphy's sign according to the ultrasound technologist.

Common bile duct:  Normal in caliber with maximum diameter
approximating 2 mm.

Liver:  Diffusely increased and coarsened echotexture without focal
hepatic parenchymal abnormality.  Patent portal vein with
hepatopetal flow.

IVC:  Patent.

Pancreas:  Normal size and echotexture without focal parenchymal
abnormality.

Spleen:  Normal size and echotexture without focal parenchymal
abnormality.

Right Kidney:  No hydronephrosis.  Well-preserved cortex.  No
shadowing calculi.  Normal size and parenchymal echotexture without
focal abnormalities. Approximately 11.4 cm length.

Left Kidney:  No hydronephrosis.  Well-preserved cortex.  At least
3 shadowing calculi, the largest in an upper pole calix
approximating 7 mm and 2 adjacent calculi in lower pole calyces
approximating 4 mm each.  Normal size and parenchymal echotexture
without focal parenchymal abnormalities.  Approximately 10.6 cm
length.

Abdominal aorta:  Normal in caliber throughout its visualized
course in the abdomen without significant atherosclerosis.  Both
common iliac arteries normal in caliber, the right approximating 7
mm and the left approximately 8 mm in diameter.
IMPRESSION: 1.  Diffuse hepatic steatosis versus hepatocellular disease.  No
focal hepatic parenchymal abnormality.
2.  Normal-appearing gallbladder.  No biliary ductal dilation.
3.  3 non-obstructing calculi in the left kidney.
4.  Normal abdominal ultrasound otherwise.

## 2013-09-03 ENCOUNTER — Other Ambulatory Visit (HOSPITAL_COMMUNITY)
Admission: RE | Admit: 2013-09-03 | Discharge: 2013-09-03 | Disposition: A | Discharge status: Home / Self Care | Payer: Medicaid Other | Source: Ambulatory Visit | Attending: Family Medicine | Admitting: Family Medicine

## 2013-09-03 ENCOUNTER — Other Ambulatory Visit: Payer: Self-pay | Admitting: Family Medicine

## 2013-09-03 DIAGNOSIS — Z01419 Encounter for gynecological examination (general) (routine) without abnormal findings: Secondary | ICD-10-CM | POA: Insufficient documentation

## 2013-09-03 DIAGNOSIS — Z1151 Encounter for screening for human papillomavirus (HPV): Secondary | ICD-10-CM | POA: Insufficient documentation

## 2020-04-06 ENCOUNTER — Other Ambulatory Visit: Payer: Self-pay | Admitting: Family Medicine

## 2020-04-06 ENCOUNTER — Other Ambulatory Visit (HOSPITAL_COMMUNITY)
Admission: RE | Admit: 2020-04-06 | Discharge: 2020-04-06 | Disposition: A | Discharge status: Home / Self Care | Payer: Medicaid Other | Source: Ambulatory Visit | Attending: Family Medicine | Admitting: Family Medicine

## 2020-04-06 DIAGNOSIS — Z0000 Encounter for general adult medical examination without abnormal findings: Secondary | ICD-10-CM | POA: Diagnosis not present

## 2020-04-07 LAB — CYTOLOGY - PAP
Comment: NEGATIVE
Diagnosis: NEGATIVE
High risk HPV: NEGATIVE

## 2020-09-30 ENCOUNTER — Other Ambulatory Visit: Payer: Self-pay

## 2020-09-30 ENCOUNTER — Emergency Department (HOSPITAL_COMMUNITY)
Admission: EM | Admit: 2020-09-30 | Discharge: 2020-09-30 | Disposition: A | Discharge status: Home / Self Care | Payer: Self-pay | Attending: Emergency Medicine | Admitting: Emergency Medicine

## 2020-09-30 ENCOUNTER — Encounter (HOSPITAL_COMMUNITY): Payer: Self-pay | Admitting: Student

## 2020-09-30 ENCOUNTER — Emergency Department (HOSPITAL_COMMUNITY): Payer: Self-pay

## 2020-09-30 DIAGNOSIS — S29011A Strain of muscle and tendon of front wall of thorax, initial encounter: Secondary | ICD-10-CM

## 2020-09-30 DIAGNOSIS — F17210 Nicotine dependence, cigarettes, uncomplicated: Secondary | ICD-10-CM | POA: Insufficient documentation

## 2020-09-30 DIAGNOSIS — X58XXXA Exposure to other specified factors, initial encounter: Secondary | ICD-10-CM | POA: Insufficient documentation

## 2020-09-30 HISTORY — DX: Anxiety disorder, unspecified: F41.9

## 2020-09-30 MED ORDER — KETOROLAC TROMETHAMINE 15 MG/ML IJ SOLN
30.0000 mg | Freq: Once | INTRAMUSCULAR | Status: AC
  Administered 2020-09-30: 30 mg via INTRAMUSCULAR
  Filled 2020-09-30: qty 2

## 2020-09-30 NOTE — Discharge Instructions (Signed)
If you develop recurrent, continued, or worsening chest pain, shortness of breath, fever, vomiting, abdominal or back pain, or any other new/concerning symptoms then return to the ER for evaluation.  

## 2020-09-30 NOTE — ED Triage Notes (Signed)
Patient BIB POV by daughter.  Patient has been sick for about a week with bad cough. Feels like she has bronchitis.  Started having bad pain in her side about 2-3 days ago but it has worsened over the last 24 hours.  Hurt with movement, coughing, and inhalation.  Has not been around anyone else that is sick and she is not vaccinated.  No recent covid tests.

## 2020-09-30 NOTE — ED Notes (Signed)
X-ray at bedside

## 2020-09-30 NOTE — ED Provider Notes (Signed)
[facility]-EMERGENCY DEPT Provider Note   CSN: [id number] Arrival date & time: 09/30/20  5027     History No chief complaint on file.   Betty Cannon is a 49 y.o. female.  HPI 49 year old female presents with left-sided chest pain.  She has had a cough for about a week that is currently improving.  She has some white sputum.  No significant shortness of breath.  Over the last 3 days as her cough has been improving she has noticed sharp chest pain under her breast.  This occurs whenever she coughs or any type of movement such as laying down or sitting up or twisting.  No leg swelling.  She tried some ibuprofen that did not really help.  No fevers during this time.   Past Medical History:  Diagnosis Date  . Anxiety     There are no problems to display for this patient.   History reviewed. No pertinent surgical history.   OB History   No obstetric history on file.     History reviewed. No pertinent family history.  Social History   Tobacco Use  . Smoking status: Smoker, Current Status Unknown    Packs/day: 0.50    Years: 20.00    Pack years: 10.00    Types: Cigarettes  . Smokeless tobacco: Never Used  Vaping Use  . Vaping Use: Some days  Substance Use Topics  . Alcohol use: Never  . Drug use: Never    Home Medications Prior to Admission medications   Medication Sig Start Date End Date Taking? Authorizing Provider  guaiFENesin (ROBITUSSIN) 100 MG/5ML liquid Take 200 mg by mouth 3 (three) times daily as needed for cough.   Yes [provider]  mineral oil-hydrophilic petrolatum (AQUAPHOR) ointment Apply 1 application topically as needed for dry skin (hands).   Yes [provider]  PARoxetine (PAXIL) 30 MG tablet Take 30 mg by mouth daily. 09/21/20  Yes [provider]    Allergies    Amoxicillin  Review of Systems   Review of Systems  Constitutional: Negative for fever.  Respiratory: Positive for cough.  Negative for shortness of breath.   Cardiovascular: Positive for chest pain. Negative for leg swelling.  All other systems reviewed and are negative.   Physical Exam Updated Vital Signs BP 115/78   Pulse 64   Temp 98.2 F (36.8 C) (Oral)   Resp 18   Ht 5\' 2"  (1.575 m)   Wt 68 kg   LMP 04/30/2020   SpO2 100%   BMI 27.44 kg/m   Physical Exam Vitals and nursing note reviewed.  Constitutional:      General: She is not in acute distress.    Appearance: She is well-developed. She is not ill-appearing or diaphoretic.  HENT:     Head: Normocephalic and atraumatic.     Right Ear: External ear normal.     Left Ear: External ear normal.     Nose: Nose normal.  Eyes:     General:        Right eye: No discharge.        Left eye: No discharge.  Cardiovascular:     Rate and Rhythm: Normal rate and regular rhythm.     Heart sounds: Normal heart sounds.  Pulmonary:     Effort: Pulmonary effort is normal.     Breath sounds: Normal breath sounds.  Chest:     Chest wall: Tenderness present.       Comments: Chest  wall tenderness inferior to left breast. No rash or bruising Abdominal:     Palpations: Abdomen is soft.     Tenderness: There is no abdominal tenderness.  Musculoskeletal:     Right lower leg: No edema.     Left lower leg: No edema.  Skin:    General: Skin is warm and dry.  Neurological:     Mental Status: She is alert.  Psychiatric:        Mood and Affect: Mood is not anxious.     ED Results / Procedures / Treatments   Labs (all labs ordered are listed, but only abnormal results are displayed) Labs Reviewed - No data to display  EKG EKG Interpretation  Date/Time:  Thursday September 30 2020 09:17:30 EDT Ventricular Rate:  67 PR Interval:    QRS Duration: 82 QT Interval:  438 QTC Calculation: 463 R Axis:   85 Text Interpretation: Sinus rhythm Consider left ventricular hypertrophy no acute ST/T changes No old tracing to compare Confirmed by Pricilla Loveless  [phone]) on 09/30/2020 9:21:46 AM   Radiology DG Chest Portable 1 View  Result Date: 09/30/2020 CLINICAL DATA:  Cough, left chest pain. EXAM: PORTABLE CHEST 1 VIEW COMPARISON:  No pertinent prior exams are available for comparison. FINDINGS: A small portion of the left lateral costophrenic angle is excluded from the field of view. Heart size within normal limits. There is no appreciable airspace consolidation. No evidence of pleural effusion or pneumothorax. No acute bony abnormality identified. IMPRESSION: A small portion of the left lateral costophrenic angle is excluded from the field of view. No evidence of active cardiopulmonary disease. Electronically Signed   By: Jackey Loge DO   On: 09/30/2020 09:42    Procedures Procedures (including critical care time)  Medications Ordered in ED Medications  ketorolac (TORADOL) 15 MG/ML injection 30 mg (30 mg Intramuscular Given 09/30/20 0930)    ED Course  I have reviewed the triage vital signs and the nursing notes.  Pertinent labs & imaging results that were available during my care of the patient were reviewed by me and considered in my medical decision making (see chart for details).    MDM Rules/Calculators/A&P                          Patient's ECG is unremarkable. Chest x-ray has been reviewed by myself and shows no evidence of pneumothorax or pneumonia or obvious rib injury. While there is a small area of the left costophrenic angle that cannot be seen, my suspicion of significant pleural effusion or other significant abnormality is low and I do not think repeat testing is needed. At this point, this appears to be a muscle strain from coughing. Advised to continue ibuprofen and Tylenol. Highly doubt ACS, PE, dissection, or acute bacterial infection. Final Clinical Impression(s) / ED Diagnoses Final diagnoses:  Chest wall muscle strain, initial encounter    Rx / DC Orders ED Discharge Orders    None       Pricilla Loveless,  MD 09/30/20 1019

## 2022-01-03 IMAGING — DX DG CHEST 1V PORT
1 series · 1 of 1 positions shown · non-contrast
Comparison: No pertinent prior exams are available for comparison.

CLINICAL DATA: Cough, left chest pain.

EXAM:
PORTABLE CHEST 1 VIEW

[chest ap]
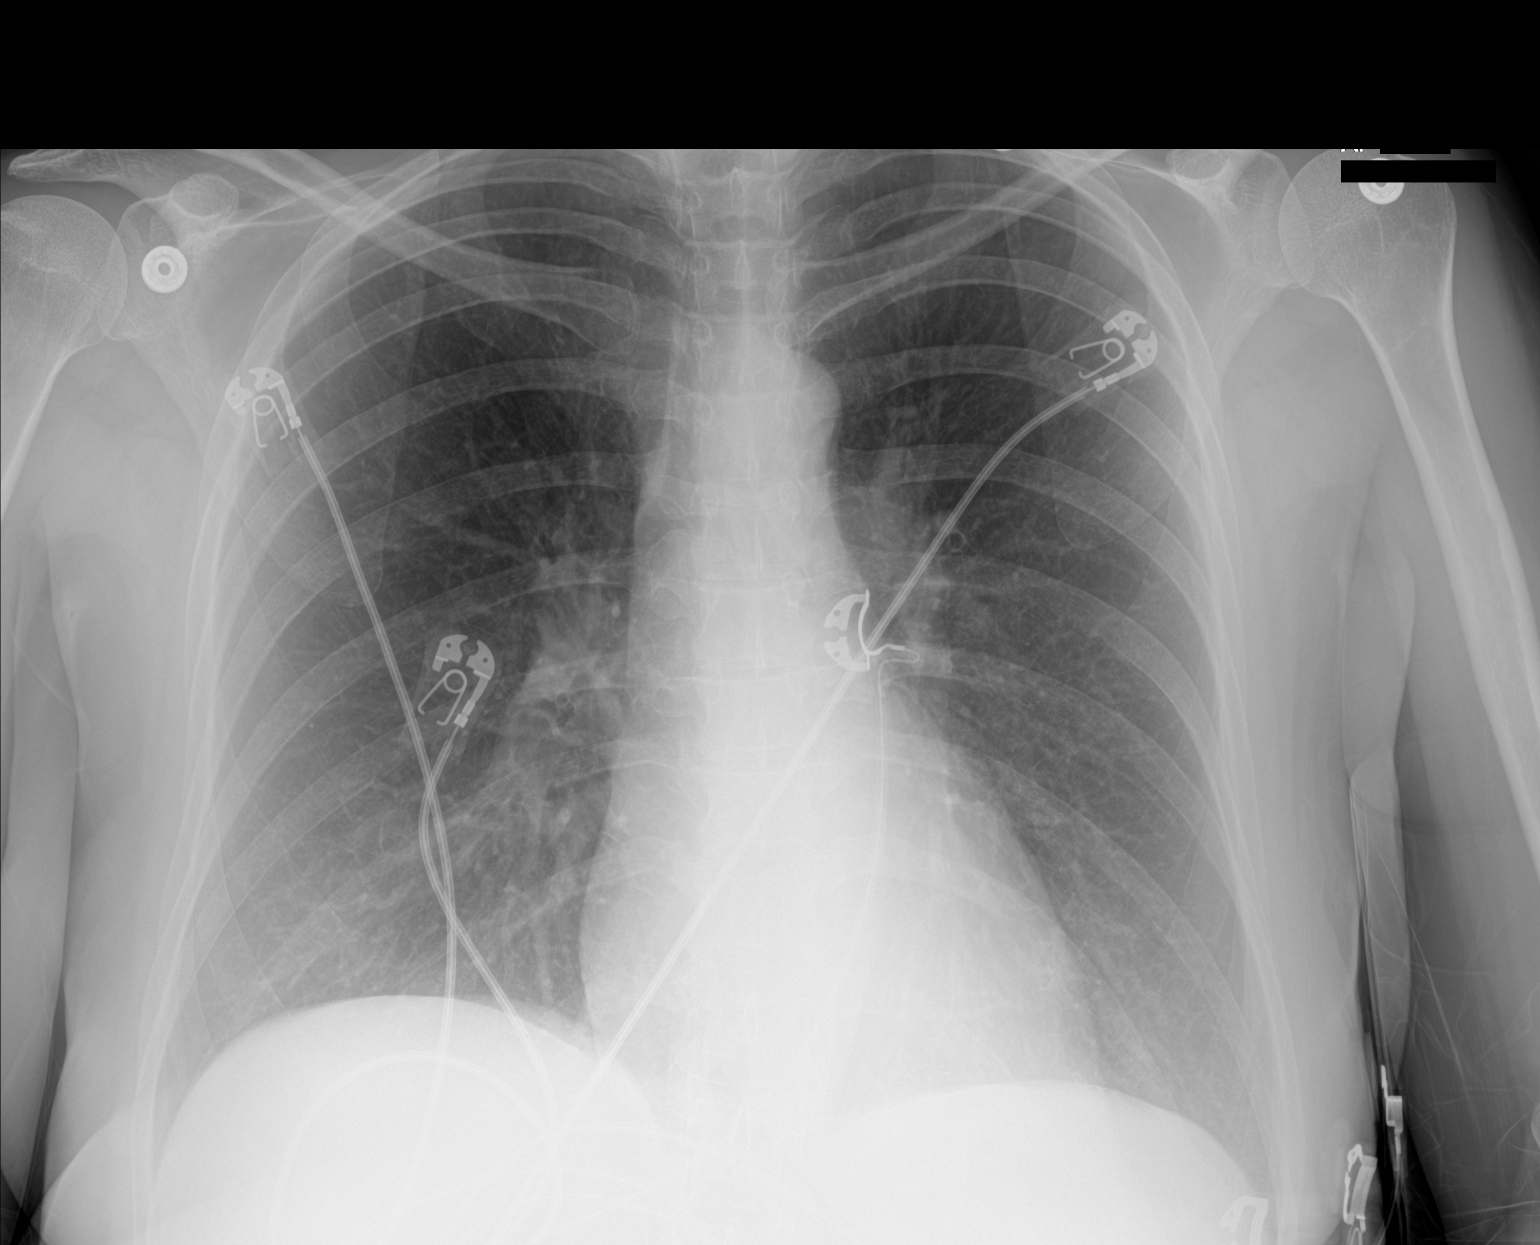

[1 of 1 positions shown; findings below may reference images not displayed]

FINDINGS: A small portion of the left lateral costophrenic angle is excluded
from the field of view.

Heart size within normal limits.

There is no appreciable airspace consolidation.

No evidence of pleural effusion or pneumothorax.

No acute bony abnormality identified.
IMPRESSION: A small portion of the left lateral costophrenic angle is excluded
from the field of view.

No evidence of active cardiopulmonary disease.

## 2022-01-10 ENCOUNTER — Telehealth: Payer: Medicaid Other | Admitting: Physician Assistant

## 2022-01-10 DIAGNOSIS — B9689 Other specified bacterial agents as the cause of diseases classified elsewhere: Secondary | ICD-10-CM

## 2022-01-10 DIAGNOSIS — J208 Acute bronchitis due to other specified organisms: Secondary | ICD-10-CM

## 2022-01-10 MED ORDER — DOXYCYCLINE HYCLATE 100 MG PO TABS: 100.0000 mg | ORAL_TABLET | Freq: Two times a day (BID) | ORAL | 0 refills | Status: DC

## 2022-01-10 MED ORDER — ALBUTEROL SULFATE HFA 108 (90 BASE) MCG/ACT IN AERS: 2.0000 | INHALATION_SPRAY | Freq: Four times a day (QID) | RESPIRATORY_TRACT | 0 refills | Status: DC | PRN

## 2022-01-10 MED ORDER — BENZONATATE 100 MG PO CAPS: 100.0000 mg | ORAL_CAPSULE | Freq: Three times a day (TID) | ORAL | 0 refills | Status: DC | PRN

## 2022-01-10 NOTE — Patient Instructions (Signed)
°  [name], thank you for joining Piedad Climes, PA-C for today's virtual visit.  While this provider is not your primary care provider (PCP), if your PCP is located in our provider database this encounter information will be shared with them immediately following your visit.  Consent: (Patient) Betty Cannon provided verbal consent for this virtual visit at the beginning of the encounter.  Current Medications:  Current Outpatient Medications:    guaiFENesin (ROBITUSSIN) 100 MG/5ML liquid, Take 200 mg by mouth 3 (three) times daily as needed for cough., Disp: , Rfl:    mineral oil-hydrophilic petrolatum (AQUAPHOR) ointment, Apply 1 application topically as needed for dry skin (hands)., Disp: , Rfl:    montelukast (SINGULAIR) 10 MG tablet, Take 10 mg by mouth daily., Disp: , Rfl:    PARoxetine (PAXIL) 30 MG tablet, Take 30 mg by mouth daily., Disp: , Rfl:    Medications ordered in this encounter:  No orders of the defined types were placed in this encounter.    *If you need refills on other medications prior to your next appointment, please contact your pharmacy*  Follow-Up: Call back or seek an in-person evaluation if the symptoms worsen or if the condition fails to improve as anticipated.  Other Instructions   If you have been instructed to have an in-person evaluation today at a local Urgent Care facility, please use the link below. It will take you to a list of all of our available [facility], including address, phone number and hours of operation. Please do not delay care.  [facility]  If you or a family member do not have a primary care provider, use the link below to schedule a visit and establish care. When you choose a [facility] primary care physician or advanced practice provider, you gain a long-term partner in health. Find a Primary Care Provider  Learn more about [facility] in-office and virtual care options: [facility] -  Get Care Now

## 2022-01-10 NOTE — Progress Notes (Signed)
Virtual Visit Consent   Betty Cannon, you are scheduled for a virtual visit with a [facility] provider today.     Just as with appointments in the office, your consent must be obtained to participate.  Your consent will be active for this visit and any virtual visit you may have with one of our providers in the next 365 days.     If you have a MyChart account, a copy of this consent can be sent to you electronically.  All virtual visits are billed to your insurance company just like a traditional visit in the office.    As this is a virtual visit, video technology does not allow for your provider to perform a traditional examination.  This may limit your provider's ability to fully assess your condition.  If your provider identifies any concerns that need to be evaluated in person or the need to arrange testing (such as labs, EKG, etc.), we will make arrangements to do so.     Although advances in technology are sophisticated, we cannot ensure that it will always work on either your end or our end.  If the connection with a video visit is poor, the visit may have to be switched to a telephone visit.  With either a video or telephone visit, we are not always able to ensure that we have a secure connection.     I need to obtain your verbal consent now.   Are you willing to proceed with your visit today?    Betty Cannon has provided verbal consent on 01/10/2022 for a virtual visit (video or telephone).   Betty Cannon, [place]   Date: 01/10/2022 10:29 AM   Virtual Visit via Video Note   I, Betty Cannon, connected with  Betty Cannon  ([id number], 08-17-1971) on 01/10/22 at 10:45 AM EST by a video-enabled telemedicine application and verified that I am speaking with the correct person using two identifiers.  Location: Patient: Virtual Visit Location Patient: Home Provider: Virtual Visit Location Provider: Home Office   I discussed the limitations of evaluation and  management by telemedicine and the availability of in person appointments. The patient expressed understanding and agreed to proceed.    History of Present Illness: Betty Cannon is a 51 y.o. who identifies as a female who was assigned female at birth, and is being seen today for URI symptoms including significant chest congestion, phlegm production, headache, chest wall tenderness with coughing, starting over a week ago. Notes symptoms initially were starting to improve after first several days but then have worsened again. Denies fever. Chest tightness worse with coughing. Denies recent travel. Has had negative COVID home tests.   HPI: HPI  Problems: There are no problems to display for this patient.   Allergies:  Allergies  Allergen Reactions   Amoxicillin Other (See Comments)    jaundace   Medications:  Current Outpatient Medications:    benzonatate (TESSALON) 100 MG capsule, Take 1 capsule (100 mg total) by mouth 3 (three) times daily as needed for cough., Disp: 30 capsule, Rfl: 0   doxycycline (VIBRA-TABS) 100 MG tablet, Take 1 tablet (100 mg total) by mouth 2 (two) times daily., Disp: 14 tablet, Rfl: 0   guaiFENesin (ROBITUSSIN) 100 MG/5ML liquid, Take 200 mg by mouth 3 (three) times daily as needed for cough., Disp: , Rfl:    mineral oil-hydrophilic petrolatum (AQUAPHOR) ointment, Apply 1 application topically as needed for dry skin (hands)., Disp: , Rfl:  montelukast (SINGULAIR) 10 MG tablet, Take 10 mg by mouth daily., Disp: , Rfl:    PARoxetine (PAXIL) 30 MG tablet, Take 30 mg by mouth daily., Disp: , Rfl:   Observations/Objective: Patient is well-developed, well-nourished in no acute distress.  Resting comfortably at home.  Head is normocephalic, atraumatic.  No labored breathing. Speech is clear and coherent with logical content.  Patient is alert and oriented at baseline.   Assessment and Plan: 1. Acute bacterial bronchitis - benzonatate (TESSALON) 100 MG capsule;  Take 1 capsule (100 mg total) by mouth 3 (three) times daily as needed for cough.  Dispense: 30 capsule; Refill: 0 - doxycycline (VIBRA-TABS) 100 MG tablet; Take 1 tablet (100 mg total) by mouth 2 (two) times daily.  Dispense: 14 tablet; Refill: 0  Rx Doxycycline.  Increase fluids.  Rest.  Saline nasal spray.  Probiotic.  Mucinex as directed.  Humidifier in bedroom. Tessalon per orders. Albuterol as directed, if needed.  Call or return to clinic if symptoms are not improving.   Follow Up Instructions: I discussed the assessment and treatment plan with the patient. The patient was provided an opportunity to ask questions and all were answered. The patient agreed with the plan and demonstrated an understanding of the instructions.  A copy of instructions were sent to the patient via MyChart unless otherwise noted below.   The patient was advised to call back or seek an in-person evaluation if the symptoms worsen or if the condition fails to improve as anticipated.  Time:  I spent 10 minutes with the patient via telehealth technology discussing the above problems/concerns.    Betty Rio, PA-C

## 2022-02-09 ENCOUNTER — Other Ambulatory Visit: Payer: Self-pay

## 2022-02-09 ENCOUNTER — Encounter (HOSPITAL_BASED_OUTPATIENT_CLINIC_OR_DEPARTMENT_OTHER): Payer: Self-pay

## 2022-02-09 ENCOUNTER — Emergency Department (HOSPITAL_BASED_OUTPATIENT_CLINIC_OR_DEPARTMENT_OTHER)
Admission: EM | Admit: 2022-02-09 | Discharge: 2022-02-09 | Disposition: A | Discharge status: Home / Self Care | Payer: Medicaid Other | Attending: Emergency Medicine | Admitting: Emergency Medicine

## 2022-02-09 DIAGNOSIS — N3001 Acute cystitis with hematuria: Secondary | ICD-10-CM | POA: Insufficient documentation

## 2022-02-09 HISTORY — DX: Calculus of kidney: N20.0

## 2022-02-09 LAB — URINALYSIS, ROUTINE W REFLEX MICROSCOPIC
Bilirubin Urine: NEGATIVE
Glucose, UA: NEGATIVE mg/dL
Ketones, ur: NEGATIVE mg/dL
Nitrite: POSITIVE — AB
Protein, ur: 100 mg/dL — AB
Specific Gravity, Urine: 1.02 (ref 1.005–1.030)
pH: 7 (ref 5.0–8.0)

## 2022-02-09 LAB — URINALYSIS, MICROSCOPIC (REFLEX): WBC, UA: >50 WBC/hpf (ref 0–5)

## 2022-02-09 LAB — PREGNANCY, URINE: Preg Test, Ur: NEGATIVE

## 2022-02-09 MED ORDER — NITROFURANTOIN MONOHYD MACRO 100 MG PO CAPS: 100.0000 mg | ORAL_CAPSULE | Freq: Two times a day (BID) | ORAL | 0 refills | Status: AC

## 2022-02-09 MED ORDER — NITROFURANTOIN MONOHYD MACRO 100 MG PO CAPS
100.0000 mg | ORAL_CAPSULE | Freq: Once | ORAL | Status: AC
  Administered 2022-02-09: 23:00:00 100 mg via ORAL
  Filled 2022-02-09: qty 1

## 2022-02-09 NOTE — ED Notes (Signed)
Rx x 1 given  Written and verbal inst to pt  Verbalized an understanding  To home  

## 2022-02-09 NOTE — Discharge Instructions (Addendum)
You were given a prescription for antibiotics. Please take the antibiotic prescription fully.   A culture was sent of your urine today to determine if there is any bacterial growth. If the results of the culture are positive and you require an antibiotic or a change of your prescribed antibiotic you will be contacted by the hospital. If the results are negative you will not be contacted.  Please follow up with your primary care provider within 5-7 days for re-evaluation of your symptoms. If you do not have a primary care provider, information for a healthcare clinic has been provided for you to make arrangements for follow up care. Please return to the emergency department for any new or worsening symptoms.  

## 2022-02-09 NOTE — ED Provider Notes (Signed)
?[facility] EMERGENCY DEPARTMENT ?Provider Note ? ? ?CSN: [id number] ?Arrival date & time: 02/09/22  2153 ? ?  ? ?History ? ?Chief Complaint  ?Patient presents with  ? Hematuria  ? ? ?Betty Cannon is a 51 y.o. female. ? ?HPI ? ?Pt is a 51 y/o female who a h/o nephrolithiasis who presents to the ED today for eval of hematuria that started earlier today. She further reports a burning sensation when she urinates. She frequency urinary frequency and urgency. Denies flank pain, fevers, nv.   ? ?Home Medications ?Prior to Admission medications   ?Medication Sig Start Date End Date Taking? Authorizing Provider  ?nitrofurantoin, macrocrystal-monohydrate, (MACROBID) 100 MG capsule Take 1 capsule (100 mg total) by mouth 2 (two) times daily for 5 days. 02/09/22 02/14/22 Yes Whittany Parish S, PA-C  ?albuterol (VENTOLIN HFA) 108 (90 Base) MCG/ACT inhaler Inhale 2 puffs into the lungs every 6 (six) hours as needed for wheezing or shortness of breath. 01/10/22   Waldon Merl, PA-C  ?benzonatate (TESSALON) 100 MG capsule Take 1 capsule (100 mg total) by mouth 3 (three) times daily as needed for cough. 01/10/22   Waldon Merl, PA-C  ?doxycycline (VIBRA-TABS) 100 MG tablet Take 1 tablet (100 mg total) by mouth 2 (two) times daily. 01/10/22   Waldon Merl, PA-C  ?guaiFENesin (ROBITUSSIN) 100 MG/5ML liquid Take 200 mg by mouth 3 (three) times daily as needed for cough.    [provider]  ?mineral oil-hydrophilic petrolatum (AQUAPHOR) ointment Apply 1 application topically as needed for dry skin (hands).    [provider]  ?montelukast (SINGULAIR) 10 MG tablet Take 10 mg by mouth daily. 12/26/21   [provider]  ?PARoxetine (PAXIL) 30 MG tablet Take 30 mg by mouth daily. 09/21/20   [provider]  ?   ? ?Allergies    ?Amoxicillin   ? ?Review of Systems   ?Review of Systems ?See HPI for pertinent positives or negatives. ? ? ?Physical Exam ?Updated Vital Signs ?BP 138/88 (BP  Location: Right Arm)   Pulse 88   Temp 98.1 ?F (36.7 ?C) (Oral)   Resp 18   Ht 5\' 2"  (1.575 m)   Wt 68.9 kg   LMP 04/30/2020   SpO2 100%   BMI 27.80 kg/m?  ?Physical Exam ?Vitals and nursing note reviewed.  ?Constitutional:   ?   General: She is not in acute distress. ?   Appearance: She is well-developed.  ?HENT:  ?   Head: Normocephalic and atraumatic.  ?Eyes:  ?   Conjunctiva/sclera: Conjunctivae normal.  ?Cardiovascular:  ?   Rate and Rhythm: Normal rate and regular rhythm.  ?   Heart sounds: No murmur heard. ?Pulmonary:  ?   Effort: Pulmonary effort is normal. No respiratory distress.  ?   Breath sounds: Normal breath sounds.  ?Abdominal:  ?   General: Bowel sounds are normal.  ?   Palpations: Abdomen is soft.  ?   Tenderness: There is no abdominal tenderness. There is no right CVA tenderness, left CVA tenderness or guarding.  ?Musculoskeletal:     ?   General: No swelling.  ?   Cervical back: Neck supple.  ?Skin: ?   General: Skin is warm and dry.  ?   Capillary Refill: Capillary refill takes less than 2 seconds.  ?Neurological:  ?   Mental Status: She is alert.  ?Psychiatric:     ?   Mood and Affect: Mood normal.  ? ? ? ?  ED Results / Procedures / Treatments   ?Labs ?(all labs ordered are listed, but only abnormal results are displayed) ?Labs Reviewed  ?URINALYSIS, ROUTINE W REFLEX MICROSCOPIC - Abnormal; Notable for the following components:  ?    Result Value  ? APPearance CLOUDY (*)   ? Hgb urine dipstick LARGE (*)   ? Protein, ur 100 (*)   ? Nitrite POSITIVE (*)   ? Leukocytes,Ua MODERATE (*)   ? All other components within normal limits  ?URINALYSIS, MICROSCOPIC (REFLEX) - Abnormal; Notable for the following components:  ? Bacteria, UA FEW (*)   ? All other components within normal limits  ?URINE CULTURE  ?PREGNANCY, URINE  ? ? ?EKG ?None ? ?Radiology ?No results found. ? ?Procedures ?Procedures  ? ? ?Medications Ordered in ED ?Medications  ?nitrofurantoin (macrocrystal-monohydrate) (MACROBID)  capsule 100 mg (has no administration in time range)  ? ? ?ED Course/ Medical Decision Making/ A&P ?  ?                        ?Medical Decision Making ?Amount and/or Complexity of Data Reviewed ?Labs: ordered. ? ?Risk ?Prescription drug management. ? ? ?Pt has been diagnosed with a UTI. Pt is afebrile, no CVA tenderness, normotensive, and denies N/V. Pt to be dc home with antibiotics and instructions to follow up with PCP if symptoms persist. ? ?. ?Final Clinical Impression(s) / ED Diagnoses ?Final diagnoses:  ?Acute cystitis with hematuria  ? ? ?Rx / DC Orders ?ED Discharge Orders   ? ?      Ordered  ?  nitrofurantoin, macrocrystal-monohydrate, (MACROBID) 100 MG capsule  2 times daily       ? 02/09/22 2308  ? ?  ?  ? ?  ? ? ?  ?Samson Frederic, Eural Holzschuh S, PA-C ?02/09/22 2310 ? ?  ?Gwyneth Sprout, MD ?02/10/22 2243 ? ?

## 2022-02-09 NOTE — ED Triage Notes (Signed)
Pt c/o hematuria started yesterday-NAD-steady gait ?

## 2022-02-12 LAB — URINE CULTURE: Culture: >=100000 — AB

## 2022-02-13 ENCOUNTER — Telehealth: Payer: Self-pay

## 2022-02-13 NOTE — Telephone Encounter (Signed)
Post ED Visit - Positive Culture Follow-up ? ?Culture report reviewed by antimicrobial stewardship pharmacist: ?[facility] ?[]  , Enzo Bi.D. ?[x]  [address], Pharm.D., BCPS AQ-ID ?[]  , Pharm.D., BCPS ?[]  Celedonio Miyamoto, Pharm.D., BCPS ?[]  [place], Garvin Fila.D., BCPS, AAHIVP ?[]  , Pharm.D., BCPS, AAHIVP ?[]  Georgina Pillion, PharmD, BCPS ?[]  , PharmD, BCPS ?[]  [place], PharmD, BCPS ?[]  [address], PharmD ?[]  , PharmD, BCPS ?[]  Estella Husk, PharmD ? ? [facility] ?[]  Lysle Pearl, PharmD ?[]  , PharmD ?[]  Phillips Climes, PharmD ?[]  , Rph ?[]  Agapito Games) , PharmD ?[]  Verlan Friends, PharmD ?[]  , PharmD ?[]  Mervyn Gay, PharmD ?[]  , PharmD ?[]  Vinnie Level, PharmD ?[]  Gerri Spore, PharmD ?[]  , PharmD ?[]  Len Childs, PharmD ? ? ?Positive urine culture ?Treated with Nitrofurantoin Monohyd Macro, organism sensitive to the same and no further patient follow-up is required at this time. ? ? ?02/13/2022, 8:59 AM ?  ?

## 2022-10-11 ENCOUNTER — Emergency Department (HOSPITAL_BASED_OUTPATIENT_CLINIC_OR_DEPARTMENT_OTHER)
Admission: EM | Admit: 2022-10-11 | Discharge: 2022-10-11 | Discharge status: Against Medical Advice | Payer: Commercial Managed Care - HMO | Attending: Emergency Medicine | Admitting: Emergency Medicine

## 2022-10-11 ENCOUNTER — Encounter (HOSPITAL_BASED_OUTPATIENT_CLINIC_OR_DEPARTMENT_OTHER): Payer: Self-pay | Admitting: Pediatrics

## 2022-10-11 ENCOUNTER — Other Ambulatory Visit: Payer: Self-pay

## 2022-10-11 ENCOUNTER — Emergency Department (HOSPITAL_BASED_OUTPATIENT_CLINIC_OR_DEPARTMENT_OTHER): Payer: Commercial Managed Care - HMO

## 2022-10-11 DIAGNOSIS — R0981 Nasal congestion: Secondary | ICD-10-CM | POA: Diagnosis not present

## 2022-10-11 DIAGNOSIS — R059 Cough, unspecified: Secondary | ICD-10-CM | POA: Insufficient documentation

## 2022-10-11 DIAGNOSIS — Z1152 Encounter for screening for COVID-19: Secondary | ICD-10-CM | POA: Diagnosis not present

## 2022-10-11 DIAGNOSIS — J45909 Unspecified asthma, uncomplicated: Secondary | ICD-10-CM | POA: Diagnosis not present

## 2022-10-11 LAB — RESP PANEL BY RT-PCR (FLU A&B, COVID) ARPGX2
Influenza A by PCR: NEGATIVE
Influenza B by PCR: NEGATIVE
SARS Coronavirus 2 by RT PCR: NEGATIVE

## 2022-10-11 NOTE — ED Triage Notes (Signed)
C/o cough and chest congestion for a week; stated hx of asthma

## 2023-02-19 ENCOUNTER — Telehealth: Payer: Medicaid Other | Admitting: Physician Assistant

## 2023-02-19 DIAGNOSIS — J208 Acute bronchitis due to other specified organisms: Secondary | ICD-10-CM

## 2023-02-19 DIAGNOSIS — B9689 Other specified bacterial agents as the cause of diseases classified elsewhere: Secondary | ICD-10-CM

## 2023-02-19 MED ORDER — AZITHROMYCIN 250 MG PO TABS: ORAL_TABLET | ORAL | 0 refills | Status: AC

## 2023-02-19 MED ORDER — PREDNISONE 10 MG (21) PO TBPK: ORAL_TABLET | ORAL | 0 refills | Status: DC

## 2023-02-19 MED ORDER — BENZONATATE 100 MG PO CAPS: 100.0000 mg | ORAL_CAPSULE | Freq: Three times a day (TID) | ORAL | 0 refills | Status: DC | PRN

## 2023-02-19 NOTE — Progress Notes (Signed)
We are sorry that you are not feeling well.  Here is how we plan to help!  Based on your presentation I believe you most likely have A cough due to bacteria.  When patients have a fever and a productive cough with a change in color or increased sputum production, we are concerned about bacterial bronchitis.  If left untreated it can progress to pneumonia.  If your symptoms do not improve with your treatment plan it is important that you contact your provider.   I have prescribed Azithromyin 250 mg: two tablets now and then one tablet daily for 4 additonal days    In addition you may use A non-prescription cough medication called Mucinex DM: take 2 tablets every 12 hours. and A prescription cough medication called Tessalon Perles 100mg. You may take 1-2 capsules every 8 hours as needed for your cough.  Prednisone 10 mg daily for 6 days (see taper instructions below)  Directions for 6 day taper: Day 1: 2 tablets before breakfast, 1 after both lunch & dinner and 2 at bedtime Day 2: 1 tab before breakfast, 1 after both lunch & dinner and 2 at bedtime Day 3: 1 tab at each meal & 1 at bedtime Day 4: 1 tab at breakfast, 1 at lunch, 1 at bedtime Day 5: 1 tab at breakfast & 1 tab at bedtime Day 6: 1 tab at breakfast  From your responses in the eVisit questionnaire you describe inflammation in the upper respiratory tract which is causing a significant cough.  This is commonly called Bronchitis and has four common causes:   Allergies Viral Infections Acid Reflux Bacterial Infection Allergies, viruses and acid reflux are treated by controlling symptoms or eliminating the cause. An example might be a cough caused by taking certain blood pressure medications. You stop the cough by changing the medication. Another example might be a cough caused by acid reflux. Controlling the reflux helps control the cough.  USE OF BRONCHODILATOR ("RESCUE") INHALERS: There is a risk from using your bronchodilator too  frequently.  The risk is that over-reliance on a medication which only relaxes the muscles surrounding the breathing tubes can reduce the effectiveness of medications prescribed to reduce swelling and congestion of the tubes themselves.  Although you feel brief relief from the bronchodilator inhaler, your asthma may actually be worsening with the tubes becoming more swollen and filled with mucus.  This can delay other crucial treatments, such as oral steroid medications. If you need to use a bronchodilator inhaler daily, several times per day, you should discuss this with your provider.  There are probably better treatments that could be used to keep your asthma under control.     HOME CARE Only take medications as instructed by your medical team. Complete the entire course of an antibiotic. Drink plenty of fluids and get plenty of rest. Avoid close contacts especially the very [place] and the elderly Cover your mouth if you cough or cough into your sleeve. Always remember to wash your hands A steam or ultrasonic humidifier can help congestion.   GET HELP RIGHT AWAY IF: You develop worsening fever. You become short of breath You cough up blood. Your symptoms persist after you have completed your treatment plan MAKE SURE YOU  Understand these instructions. Will watch your condition. Will get help right away if you are not doing well or get worse.    Thank you for choosing an e-visit.  Your e-visit answers were reviewed by a board certified advanced clinical   practitioner to complete your personal care plan. Depending upon the condition, your plan could have included both over the counter or prescription medications.  Please review your pharmacy choice. Make sure the pharmacy is open so you can pick up prescription now. If there is a problem, you may contact your provider through MyChart messaging and have the prescription routed to another pharmacy.  Your safety is important to us. If you have  drug allergies check your prescription carefully.   For the next 24 hours you can use MyChart to ask questions about today's visit, request a non-urgent call back, or ask for a work or school excuse. You will get an email in the next two days asking about your experience. I hope that your e-visit has been valuable and will speed your recovery.  I have spent 5 minutes in review of e-visit questionnaire, review and updating patient chart, medical decision making and response to patient.   Obediah Welles M Adreyan Carbajal, PA-C  

## 2023-04-05 ENCOUNTER — Telehealth: Payer: Medicaid Other | Admitting: Physician Assistant

## 2023-04-05 DIAGNOSIS — J4541 Moderate persistent asthma with (acute) exacerbation: Secondary | ICD-10-CM | POA: Diagnosis not present

## 2023-04-05 DIAGNOSIS — J019 Acute sinusitis, unspecified: Secondary | ICD-10-CM | POA: Diagnosis not present

## 2023-04-05 DIAGNOSIS — B9689 Other specified bacterial agents as the cause of diseases classified elsewhere: Secondary | ICD-10-CM

## 2023-04-05 MED ORDER — ALBUTEROL SULFATE HFA 108 (90 BASE) MCG/ACT IN AERS: 2.0000 | INHALATION_SPRAY | Freq: Four times a day (QID) | RESPIRATORY_TRACT | 0 refills | Status: DC | PRN

## 2023-04-05 MED ORDER — DOXYCYCLINE HYCLATE 100 MG PO TABS: 100.0000 mg | ORAL_TABLET | Freq: Two times a day (BID) | ORAL | 0 refills | Status: DC

## 2023-04-05 MED ORDER — FLUTICASONE PROPIONATE HFA 110 MCG/ACT IN AERO
1.0000 | INHALATION_SPRAY | Freq: Two times a day (BID) | RESPIRATORY_TRACT | 0 refills | Status: AC
Start: 2023-04-05 — End: ?

## 2023-04-05 NOTE — Progress Notes (Signed)
E-Visit for Sinus Problems  We are sorry that you are not feeling well.  Here is how we plan to help!  Based on what you have shared with me it looks like you have sinusitis.  Sinusitis is inflammation and infection in the sinus cavities of the head.  Based on your presentation I believe you most likely have Acute Bacterial Sinusitis.  This is an infection caused by bacteria and is treated with antibiotics. I have prescribed Doxycycline 100mg  by mouth twice a day for 10 days. You may use an oral decongestant such as Mucinex D or if you have glaucoma or high blood pressure use plain Mucinex. Saline nasal spray help and can safely be used as often as needed for congestion.  If you develop worsening sinus pain, fever or notice severe headache and vision changes, or if symptoms are not better after completion of antibiotic, please schedule an appointment with a health care provider.    Sinus infections are not as easily transmitted as other respiratory infection, however we still recommend that you avoid close contact with loved ones, especially the very [place] and elderly.  Remember to wash your hands thoroughly throughout the day as this is the number one way to prevent the spread of infection!  I have also prescribed Albuterol inhaler Use 1-2 puffs every 6 hours as needed for chest tightness, shortness of breath and wheezing. I have also added Flovent inhaler Use 1 puff twice daily.   With looking at your recurrent bronchitis episodes requiring prednisone every 3-4 months, you may benefit by seeing your Primary care provider or Pulmonologist (if you have) to have your inhalers adjusted for better coverage.   Home Care: Only take medications as instructed by your medical team. Complete the entire course of an antibiotic. Do not take these medications with alcohol. A steam or ultrasonic humidifier can help congestion.  You can place a towel over your head and breathe in the steam from hot water coming  from a faucet. Avoid close contacts especially the very [place] and the elderly. Cover your mouth when you cough or sneeze. Always remember to wash your hands.  Get Help Right Away If: You develop worsening fever or sinus pain. You develop a severe head ache or visual changes. Your symptoms persist after you have completed your treatment plan.  Make sure you Understand these instructions. Will watch your condition. Will get help right away if you are not doing well or get worse.  Thank you for choosing an e-visit.  Your e-visit answers were reviewed by a board certified advanced clinical practitioner to complete your personal care plan. Depending upon the condition, your plan could have included both over the counter or prescription medications.  Please review your pharmacy choice. Make sure the pharmacy is open so you can pick up prescription now. If there is a problem, you may contact your provider through [employer] and have the prescription routed to another pharmacy.  Your safety is important to [place]. If you have drug allergies check your prescription carefully.   For the next 24 hours you can use MyChart to ask questions about today's visit, request a non-urgent call back, or ask for a work or school excuse. You will get an email in the next two days asking about your experience. I hope that your e-visit has been valuable and will speed your recovery.  I have spent 5 minutes in review of e-visit questionnaire, review and updating patient chart, medical decision making and response  to patient.   Margaretann Loveless, PA-C

## 2023-10-12 DIAGNOSIS — N200 Calculus of kidney: Secondary | ICD-10-CM | POA: Diagnosis not present

## 2023-10-12 DIAGNOSIS — Z1211 Encounter for screening for malignant neoplasm of colon: Secondary | ICD-10-CM | POA: Diagnosis not present

## 2023-10-12 DIAGNOSIS — Z1239 Encounter for other screening for malignant neoplasm of breast: Secondary | ICD-10-CM | POA: Diagnosis not present

## 2023-10-12 DIAGNOSIS — F411 Generalized anxiety disorder: Secondary | ICD-10-CM | POA: Diagnosis not present

## 2023-12-05 ENCOUNTER — Telehealth: Payer: 59 | Admitting: Family Medicine

## 2023-12-05 DIAGNOSIS — J019 Acute sinusitis, unspecified: Secondary | ICD-10-CM

## 2023-12-05 MED ORDER — PREDNISONE 10 MG (21) PO TBPK
ORAL_TABLET | ORAL | 0 refills | Status: DC
Start: 2023-12-05 — End: 2024-02-20

## 2023-12-05 NOTE — Patient Instructions (Signed)
 Labrenda Leeroy Salt, thank you for joining Roosvelt Mater, PA-C for today's virtual visit.  While this provider is not your primary care provider (PCP), if your PCP is located in our provider database this encounter information will be shared with them immediately following your visit.   A [place] MyChart account gives you access to today's visit and all your visits, tests, and labs performed at [facility]  click here if you don't have a [facility] MyChart account or go to [email or web address]  Consent: (Patient) Betty Cannon provided verbal consent for this virtual visit at the beginning of the encounter.  Current Medications:  Current Outpatient Medications:    predniSONE  (STERAPRED UNI-PAK 21 TAB) 10 MG (21) TBPK tablet, Take following package directions., Disp: 21 tablet, Rfl: 0   albuterol  (VENTOLIN  HFA) 108 (90 Base) MCG/ACT inhaler, Inhale 2 puffs into the lungs every 6 (six) hours as needed for wheezing or shortness of breath., Disp: 8 g, Rfl: 0   benzonatate  (TESSALON ) 100 MG capsule, Take 1 capsule (100 mg total) by mouth 3 (three) times daily as needed., Disp: 30 capsule, Rfl: 0   doxycycline  (VIBRA -TABS) 100 MG tablet, Take 1 tablet (100 mg total) by mouth 2 (two) times daily., Disp: 20 tablet, Rfl: 0   fluticasone  (FLOVENT  HFA) 110 MCG/ACT inhaler, Inhale 1 puff into the lungs 2 (two) times daily., Disp: 12 g, Rfl: 0   guaiFENesin (ROBITUSSIN) 100 MG/5ML liquid, Take 200 mg by mouth 3 (three) times daily as needed for cough., Disp: , Rfl:    mineral oil-hydrophilic petrolatum (AQUAPHOR) ointment, Apply 1 application topically as needed for dry skin (hands)., Disp: , Rfl:    montelukast (SINGULAIR) 10 MG tablet, Take 10 mg by mouth daily., Disp: , Rfl:    PARoxetine (PAXIL) 30 MG tablet, Take 30 mg by mouth daily., Disp: , Rfl:    Medications ordered in this encounter:  Meds ordered this encounter  Medications   predniSONE  (STERAPRED UNI-PAK 21 TAB) 10 MG  (21) TBPK tablet    Sig: Take following package directions.    Dispense:  21 tablet    Refill:  0    Please dispense one standard blister pack taper.     *If you need refills on other medications prior to your next appointment, please contact your pharmacy*  Follow-Up: Call back or seek an in-person evaluation if the symptoms worsen or if the condition fails to improve as anticipated.  [facility] [phone]  Other Instructions Sinus Infection, Adult A sinus infection, also called sinusitis, is inflammation of your sinuses. Sinuses are hollow spaces in the bones around your face. Your sinuses are located: Around your eyes. In the middle of your forehead. Behind your nose. In your cheekbones. Mucus normally drains out of your sinuses. When your nasal tissues become inflamed or swollen, mucus can become trapped or blocked. This allows bacteria, viruses, and fungi to grow, which leads to infection. Most infections of the sinuses are caused by a virus. A sinus infection can develop quickly. It can last for up to 4 weeks (acute) or for more than 12 weeks (chronic). A sinus infection often develops after a cold. What are the causes? This condition is caused by anything that creates swelling in the sinuses or stops mucus from draining. This includes: Allergies. Asthma. Infection from bacteria or viruses. Deformities or blockages in your nose or sinuses. Abnormal growths in the nose (nasal polyps). Pollutants, such as chemicals or irritants in the air. Infection  from fungi. This is rare. What increases the risk? You are more likely to develop this condition if you: Have a weak body defense system (immune system). Do a lot of swimming or diving. Overuse nasal sprays. Smoke. What are the signs or symptoms? The main symptoms of this condition are pain and a feeling of pressure around the affected sinuses. Other symptoms include: Stuffy nose or congestion that makes it  difficult to breathe through your nose. Thick yellow or greenish drainage from your nose. Tenderness, swelling, and warmth over the affected sinuses. A cough that may get worse at night. Decreased sense of smell and taste. Extra mucus that collects in the throat or the back of the nose (postnasal drip) causing a sore throat or bad breath. Tiredness (fatigue). Fever. How is this diagnosed? This condition is diagnosed based on: Your symptoms. Your medical history. A physical exam. Tests to find out if your condition is acute or chronic. This may include: Checking your nose for nasal polyps. Viewing your sinuses using a device that has a light (endoscope). Testing for allergies or bacteria. Imaging tests, such as an MRI or CT scan. In rare cases, a bone biopsy may be done to rule out more serious types of fungal sinus disease. How is this treated? Treatment for a sinus infection depends on the cause and whether your condition is chronic or acute. If caused by a virus, your symptoms should go away on their own within 10 days. You may be given medicines to relieve symptoms. They include: Medicines that shrink swollen nasal passages (decongestants). A spray that eases inflammation of the nostrils (topical intranasal corticosteroids). Rinses that help get rid of thick mucus in your nose (nasal saline washes). Medicines that treat allergies (antihistamines). Over-the-counter pain relievers. If caused by bacteria, your health care provider may recommend waiting to see if your symptoms improve. Most bacterial infections will get better without antibiotic medicine. You may be given antibiotics if you have: A severe infection. A weak immune system. If caused by narrow nasal passages or nasal polyps, surgery may be needed. Follow these instructions at home: Medicines Take, use, or apply over-the-counter and prescription medicines only as told by your health care provider. These may include nasal  sprays. If you were prescribed an antibiotic medicine, take it as told by your health care provider. Do not stop taking the antibiotic even if you start to feel better. Hydrate and humidify  Drink enough fluid to keep your urine pale yellow. Staying hydrated will help to thin your mucus. Use a cool mist humidifier to keep the humidity level in your home above 50%. Inhale steam for 10-15 minutes, 3-4 times a day, or as told by your health care provider. You can do this in the bathroom while a hot shower is running. Limit your exposure to cool or dry air. Rest Rest as much as possible. Sleep with your head raised (elevated). Make sure you get enough sleep each night. General instructions  Apply a warm, moist washcloth to your face 3-4 times a day or as told by your health care provider. This will help with discomfort. Use nasal saline washes as often as told by your health care provider. Wash your hands often with soap and water to reduce your exposure to germs. If soap and water are not available, use hand sanitizer. Do not smoke. Avoid being around people who are smoking (secondhand smoke). Keep all follow-up visits. This is important. Contact a health care provider if: You have a  fever. Your symptoms get worse. Your symptoms do not improve within 10 days. Get help right away if: You have a severe headache. You have persistent vomiting. You have severe pain or swelling around your face or eyes. You have vision problems. You develop confusion. Your neck is stiff. You have trouble breathing. These symptoms may be an emergency. Get help right away. Call 911. Do not wait to see if the symptoms will go away. Do not drive yourself to the hospital. Summary A sinus infection is soreness and inflammation of your sinuses. Sinuses are hollow spaces in the bones around your face. This condition is caused by nasal tissues that become inflamed or swollen. The swelling traps or blocks the flow  of mucus. This allows bacteria, viruses, and fungi to grow, which leads to infection. If you were prescribed an antibiotic medicine, take it as told by your health care provider. Do not stop taking the antibiotic even if you start to feel better. Keep all follow-up visits. This is important. This information is not intended to replace advice given to you by your health care provider. Make sure you discuss any questions you have with your health care provider. Document Revised: 10/25/2021 Document Reviewed: 10/25/2021 Elsevier Patient Education  2024 Elsevier Inc.    If you have been instructed to have an in-person evaluation today at a local Urgent Care facility, please use the link below. It will take you to a list of all of our available [facility], including address, phone number and hours of operation. Please do not delay care.  [facility]  If you or a family member do not have a primary care provider, use the link below to schedule a visit and establish care. When you choose a [facility] primary care physician or advanced practice provider, you gain a long-term partner in health. Find a Primary Care Provider  Learn more about [facility] in-office and virtual care options: [facility] Get Care Now

## 2023-12-05 NOTE — Progress Notes (Signed)
 Virtual Visit Consent   Betty Cannon, you are scheduled for a virtual visit with a [facility] provider today. Just as with appointments in the office, your consent must be obtained to participate. Your consent will be active for this visit and any virtual visit you may have with one of our providers in the next 365 days. If you have a MyChart account, a copy of this consent can be sent to you electronically.  As this is a virtual visit, video technology does not allow for your provider to perform a traditional examination. This may limit your provider's ability to fully assess your condition. If your provider identifies any concerns that need to be evaluated in person or the need to arrange testing (such as labs, EKG, etc.), we will make arrangements to do so. Although advances in technology are sophisticated, we cannot ensure that it will always work on either your end or our end. If the connection with a video visit is poor, the visit may have to be switched to a telephone visit. With either a video or telephone visit, we are not always able to ensure that we have a secure connection.  By engaging in this virtual visit, you consent to the provision of healthcare and authorize for your insurance to be billed (if applicable) for the services provided during this visit. Depending on your insurance coverage, you may receive a charge related to this service.  I need to obtain your verbal consent now. Are you willing to proceed with your visit today? Betty Cannon has provided verbal consent on 12/05/2023 for a virtual visit (video or telephone). Betty Cannon, [place]  Date: 12/05/2023 10:08 AM  Virtual Visit via Video Note   I, Betty Cannon, connected with  Betty Cannon  ([id number][birth year]) on 12/05/23 at 10:00 AM EST by a video-enabled telemedicine application and verified that I am speaking with the correct person using two identifiers.  Location: Patient: Virtual Visit Location Patient:  Home Provider: Virtual Visit Location Provider: Home Office   I discussed the limitations of evaluation and management by telemedicine and the availability of in person appointments. The patient expressed understanding and agreed to proceed.    History of Present Illness: Betty Cannon is a 53 y.o. who identifies as a female who was assigned female at birth, and is being seen today for c/o having a persistent headache from base of skull, face, eyes and jaw. Pt states she has a slight cough and congestion and facial pressure.  Pt denies any mucus.  Pt states taking Tylenol  and allergy medication as well as nasal spray. . Pt states symptoms ongoing for two week on and off.  Pt states has tried nasal spray. Pt denies vision changes.   HPI: HPI  Problems: There are no active problems to display for this patient.   Allergies:  Allergies  Allergen Reactions   Amoxicillin Other (See Comments)    jaundace   Medications:  Current Outpatient Medications:    predniSONE  (STERAPRED UNI-PAK 21 TAB) 10 MG (21) TBPK tablet, Take following package directions., Disp: 21 tablet, Rfl: 0   albuterol  (VENTOLIN  HFA) 108 (90 Base) MCG/ACT inhaler, Inhale 2 puffs into the lungs every 6 (six) hours as needed for wheezing or shortness of breath., Disp: 8 g, Rfl: 0   benzonatate  (TESSALON ) 100 MG capsule, Take 1 capsule (100 mg total) by mouth 3 (three) times daily as needed., Disp: 30 capsule, Rfl: 0   doxycycline  (VIBRA -TABS) 100 MG tablet, Take  1 tablet (100 mg total) by mouth 2 (two) times daily., Disp: 20 tablet, Rfl: 0   fluticasone  (FLOVENT  HFA) 110 MCG/ACT inhaler, Inhale 1 puff into the lungs 2 (two) times daily., Disp: 12 g, Rfl: 0   guaiFENesin (ROBITUSSIN) 100 MG/5ML liquid, Take 200 mg by mouth 3 (three) times daily as needed for cough., Disp: , Rfl:    mineral oil-hydrophilic petrolatum (AQUAPHOR) ointment, Apply 1 application topically as needed for dry skin (hands)., Disp: , Rfl:    montelukast  (SINGULAIR) 10 MG tablet, Take 10 mg by mouth daily., Disp: , Rfl:    PARoxetine (PAXIL) 30 MG tablet, Take 30 mg by mouth daily., Disp: , Rfl:   Observations/Objective: Patient is well-developed, well-nourished in no acute distress.  Resting comfortably at home.  Head is normocephalic, atraumatic.  No labored breathing.  Speech is clear and coherent with logical content.  Patient is alert and oriented at baseline.    Assessment and Plan: 1. Acute sinusitis, recurrence not specified, unspecified location (Primary) - predniSONE  (STERAPRED UNI-PAK 21 TAB) 10 MG (21) TBPK tablet; Take following package directions.  Dispense: 21 tablet; Refill: 0  -Pt to continue Tylenol , allergy medication and nasal spray as pescribed -Advised Pt if symptoms persist to follow up with PCP for further evaluation -Possible sinus pressure but because she has no sinus drainage, change in mucus color or other associated symptoms, may just be a viral infection versus a headache.   Follow Up Instructions: I discussed the assessment and treatment plan with the patient. The patient was provided an opportunity to ask questions and all were answered. The patient agreed with the plan and demonstrated an understanding of the instructions.  A copy of instructions were sent to the patient via MyChart unless otherwise noted below.    The patient was advised to call back or seek an in-person evaluation if the symptoms worsen or if the condition fails to improve as anticipated.    Betty Mater, PA-C

## 2024-02-20 ENCOUNTER — Telehealth: Admitting: Family Medicine

## 2024-02-20 DIAGNOSIS — J4541 Moderate persistent asthma with (acute) exacerbation: Secondary | ICD-10-CM

## 2024-02-20 DIAGNOSIS — J40 Bronchitis, not specified as acute or chronic: Secondary | ICD-10-CM | POA: Diagnosis not present

## 2024-02-20 MED ORDER — ALBUTEROL SULFATE HFA 108 (90 BASE) MCG/ACT IN AERS
2.0000 | INHALATION_SPRAY | Freq: Four times a day (QID) | RESPIRATORY_TRACT | 0 refills | Status: AC | PRN
Start: 2024-02-20 — End: ?

## 2024-02-20 MED ORDER — BENZONATATE 100 MG PO CAPS: 100.0000 mg | ORAL_CAPSULE | Freq: Three times a day (TID) | ORAL | 0 refills | Status: AC | PRN

## 2024-02-20 MED ORDER — PREDNISONE 20 MG PO TABS
40.0000 mg | ORAL_TABLET | Freq: Every day | ORAL | 0 refills | Status: AC
Start: 2024-02-20 — End: 2024-02-25

## 2024-02-20 NOTE — Progress Notes (Signed)
 Virtual Visit Consent   Betty Cannon, you are scheduled for a virtual visit with a [facility] provider today. Just as with appointments in the office, your consent must be obtained to participate. Your consent will be active for this visit and any virtual visit you may have with one of our providers in the next 365 days. If you have a MyChart account, a copy of this consent can be sent to you electronically.  As this is a virtual visit, video technology does not allow for your provider to perform a traditional examination. This may limit your provider's ability to fully assess your condition. If your provider identifies any concerns that need to be evaluated in person or the need to arrange testing (such as labs, EKG, etc.), we will make arrangements to do so. Although advances in technology are sophisticated, we cannot ensure that it will always work on either your end or our end. If the connection with a video visit is poor, the visit may have to be switched to a telephone visit. With either a video or telephone visit, we are not always able to ensure that we have a secure connection.  By engaging in this virtual visit, you consent to the provision of healthcare and authorize for your insurance to be billed (if applicable) for the services provided during this visit. Depending on your insurance coverage, you may receive a charge related to this service.  I need to obtain your verbal consent now. Are you willing to proceed with your visit today? Rosaura Adaley Kiene has provided verbal consent on 02/20/2024 for a virtual visit (video or telephone). Freddy Finner, NP  Date: 02/20/2024 2:46 PM   Virtual Visit via Video Note   I, Freddy Finner, connected with  Dominica Kent  ([id number], 09-14-1971) on 02/20/24 at  2:45 PM EDT by a video-enabled telemedicine application and verified that I am speaking with the correct person using two identifiers.  Location: Patient: Virtual Visit Location Patient:  Home Provider: Virtual Visit Location Provider: Home Office   I discussed the limitations of evaluation and management by telemedicine and the availability of in person appointments. The patient expressed understanding and agreed to proceed.    History of Present Illness: Betty Cannon is a 53 y.o. who identifies as a female who was assigned female at birth, and is being seen today for allergies/bronchitis   Onset was last several weeks-increased in coughing, use of nebs, and rescue inhaler. Is on Flovent and Singular as well as OTC allergy Claritin pill. Reports wheezing, coughing spasms, shortness of breath (using the inhalers and nebs for this)  Associated symptoms are as stated above  Modifying factors are as stated above Denies chest pain, fevers, chills  Exposure to sick contacts- unknown   Problems: There are no active problems to display for this patient.   Allergies:  Allergies  Allergen Reactions   Amoxicillin Other (See Comments)    jaundace   Medications:  Current Outpatient Medications:    albuterol (VENTOLIN HFA) 108 (90 Base) MCG/ACT inhaler, Inhale 2 puffs into the lungs every 6 (six) hours as needed for wheezing or shortness of breath., Disp: 8 g, Rfl: 0   benzonatate (TESSALON) 100 MG capsule, Take 1 capsule (100 mg total) by mouth 3 (three) times daily as needed., Disp: 30 capsule, Rfl: 0   doxycycline (VIBRA-TABS) 100 MG tablet, Take 1 tablet (100 mg total) by mouth 2 (two) times daily., Disp: 20 tablet, Rfl: 0  fluticasone (FLOVENT HFA) 110 MCG/ACT inhaler, Inhale 1 puff into the lungs 2 (two) times daily., Disp: 12 g, Rfl: 0   guaiFENesin (ROBITUSSIN) 100 MG/5ML liquid, Take 200 mg by mouth 3 (three) times daily as needed for cough., Disp: , Rfl:    mineral oil-hydrophilic petrolatum (AQUAPHOR) ointment, Apply 1 application topically as needed for dry skin (hands)., Disp: , Rfl:    montelukast (SINGULAIR) 10 MG tablet, Take 10 mg by mouth daily., Disp: , Rfl:     PARoxetine (PAXIL) 30 MG tablet, Take 30 mg by mouth daily., Disp: , Rfl:    predniSONE (STERAPRED UNI-PAK 21 TAB) 10 MG (21) TBPK tablet, Take following package directions., Disp: 21 tablet, Rfl: 0  Observations/Objective: Patient is well-developed, well-nourished in no acute distress.  Resting comfortably  at home.  Head is normocephalic, atraumatic.  No labored breathing.  Speech is clear and coherent with logical content.  Patient is alert and oriented at baseline.    Assessment and Plan:   1. Bronchitis (Primary)  - predniSONE (DELTASONE) 20 MG tablet; Take 2 tablets (40 mg total) by mouth daily with breakfast for 5 days.  Dispense: 10 tablet; Refill: 0  2. Moderate persistent asthma with exacerbation  - albuterol (VENTOLIN HFA) 108 (90 Base) MCG/ACT inhaler; Inhale 2 puffs into the lungs every 6 (six) hours as needed for wheezing or shortness of breath.  Dispense: 8 g; Refill: 0   - Take meds as prescribed - Rest voice - Use a cool mist humidifier especially during the winter months when heat dries out the air. - Use saline nose sprays frequently to help soothe nasal passages if they are drying out. - Stay hydrated by drinking plenty of fluids - Keep thermostat turn down low to prevent drying out which can cause a dry cough. - For any cough or congestion- robitussin DM or Delsym as needed - For fever or aches or pains- take tylenol or ibuprofen as directed on bottle             * for fevers greater than 101 orally you may alternate ibuprofen and tylenol every 3 hours.  If you do not improve you will need a follow up visit in person.                Reviewed side effects, risks and benefits of medication.    Patient acknowledged agreement and understanding of the plan.   Past Medical, Surgical, Social History, Allergies, and Medications have been Reviewed.     Follow Up Instructions: I discussed the assessment and treatment plan with the patient. The patient  was provided an opportunity to ask questions and all were answered. The patient agreed with the plan and demonstrated an understanding of the instructions.  A copy of instructions were sent to the patient via MyChart unless otherwise noted below.    The patient was advised to call back or seek an in-person evaluation if the symptoms worsen or if the condition fails to improve as anticipated.    Freddy Finner, NP

## 2024-02-20 NOTE — Patient Instructions (Signed)
 Betty Cannon, thank you for joining Freddy Finner, NP for today's virtual visit.  While this provider is not your primary care provider (PCP), if your PCP is located in our provider database this encounter information will be shared with them immediately following your visit.   A [place] MyChart account gives you access to today's visit and all your visits, tests, and labs performed at [facility] " click here if you don't have a [place] MyChart account or go to [email or web address]  Consent: (Patient) Betty Cannon provided verbal consent for this virtual visit at the beginning of the encounter.  Current Medications:  Current Outpatient Medications:    predniSONE (DELTASONE) 20 MG tablet, Take 2 tablets (40 mg total) by mouth daily with breakfast for 5 days., Disp: 10 tablet, Rfl: 0   albuterol (VENTOLIN HFA) 108 (90 Base) MCG/ACT inhaler, Inhale 2 puffs into the lungs every 6 (six) hours as needed for wheezing or shortness of breath., Disp: 8 g, Rfl: 0   benzonatate (TESSALON) 100 MG capsule, Take 1 capsule (100 mg total) by mouth 3 (three) times daily as needed., Disp: 30 capsule, Rfl: 0   doxycycline (VIBRA-TABS) 100 MG tablet, Take 1 tablet (100 mg total) by mouth 2 (two) times daily., Disp: 20 tablet, Rfl: 0   fluticasone (FLOVENT HFA) 110 MCG/ACT inhaler, Inhale 1 puff into the lungs 2 (two) times daily., Disp: 12 g, Rfl: 0   guaiFENesin (ROBITUSSIN) 100 MG/5ML liquid, Take 200 mg by mouth 3 (three) times daily as needed for cough., Disp: , Rfl:    mineral oil-hydrophilic petrolatum (AQUAPHOR) ointment, Apply 1 application topically as needed for dry skin (hands)., Disp: , Rfl:    montelukast (SINGULAIR) 10 MG tablet, Take 10 mg by mouth daily., Disp: , Rfl:    PARoxetine (PAXIL) 30 MG tablet, Take 30 mg by mouth daily., Disp: , Rfl:    Medications ordered in this encounter:  Meds ordered this encounter  Medications   predniSONE (DELTASONE) 20 MG tablet     Sig: Take 2 tablets (40 mg total) by mouth daily with breakfast for 5 days.    Dispense:  10 tablet    Refill:  0    Supervising Provider:   Merrilee Jansky [[id number]]   albuterol (VENTOLIN HFA) 108 (90 Base) MCG/ACT inhaler    Sig: Inhale 2 puffs into the lungs every 6 (six) hours as needed for wheezing or shortness of breath.    Dispense:  8 g    Refill:  0    Supervising Provider:   Merrilee Jansky [[id number]]   benzonatate (TESSALON) 100 MG capsule    Sig: Take 1 capsule (100 mg total) by mouth 3 (three) times daily as needed.    Dispense:  30 capsule    Refill:  0    Supervising Provider:   Merrilee Jansky [[id number]]     *If you need refills on other medications prior to your next appointment, please contact your pharmacy*  Follow-Up: Call back or seek an in-person evaluation if the symptoms worsen or if the condition fails to improve as anticipated.  [facility] [phone]  Other Instructions   - Take meds as prescribed - Rest voice - Use a cool mist humidifier especially during the winter months when heat dries out the air. - Use saline nose sprays frequently to help soothe nasal passages if they are drying out. - Stay hydrated by drinking plenty of fluids - Keep thermostat  turn down low to prevent drying out which can cause a dry cough. - For any cough or congestion- robitussin DM or Delsym as needed - For fever or aches or pains- take tylenol or ibuprofen as directed on bottle             * for fevers greater than 101 orally you may alternate ibuprofen and tylenol every 3 hours.  If you do not improve you will need a follow up visit in person.                  If you have been instructed to have an in-person evaluation today at a local Urgent Care facility, please use the link below. It will take you to a list of all of our available [facility], including address, phone number and hours of operation. Please do not delay care.   [facility]  If you or a family member do not have a primary care provider, use the link below to schedule a visit and establish care. When you choose a [place] primary care physician or advanced practice provider, you gain a long-term partner in health. Find a Primary Care Provider  Learn more about [facility]'s in-office and virtual care options: [place] - Get Care Now

## 2024-06-16 ENCOUNTER — Telehealth: Admitting: Physician Assistant

## 2024-06-16 DIAGNOSIS — J4541 Moderate persistent asthma with (acute) exacerbation: Secondary | ICD-10-CM

## 2024-06-16 MED ORDER — PREDNISONE 10 MG PO TABS
ORAL_TABLET | ORAL | 0 refills | Status: DC
Start: 2024-06-16 — End: 2024-08-01

## 2024-06-16 NOTE — Patient Instructions (Signed)
 Betty Cannon, thank you for joining Betty [name], PA-C for today's virtual visit.  While this provider is not your primary care provider (PCP), if your PCP is located in our provider database this encounter information will be shared with them immediately following your visit.   A [place] MyChart account gives you access to today's visit and all your visits, tests, and labs performed at [facility]  click here if you don't have a [place] MyChart account or go to [email or web address]  Consent: (Patient) Betty Cannon provided verbal consent for this virtual visit at the beginning of the encounter.  Current Medications:  Current Outpatient Medications:    predniSONE  (DELTASONE ) 10 MG tablet, Days 1-4 take 4 tablets (40 mg) daily  Days 5-8 take 3 tablets (30 mg) daily, Days 9-11 take 2 tablets (20 mg) daily, Days 12-14 take 1 tablet (10 mg) daily., Disp: 37 tablet, Rfl: 0   albuterol  (VENTOLIN  HFA) 108 (90 Base) MCG/ACT inhaler, Inhale 2 puffs into the lungs every 6 (six) hours as needed for wheezing or shortness of breath., Disp: 8 g, Rfl: 0   benzonatate  (TESSALON ) 100 MG capsule, Take 1 capsule (100 mg total) by mouth 3 (three) times daily as needed., Disp: 30 capsule, Rfl: 0   doxycycline  (VIBRA -TABS) 100 MG tablet, Take 1 tablet (100 mg total) by mouth 2 (two) times daily., Disp: 20 tablet, Rfl: 0   fluticasone  (FLOVENT  HFA) 110 MCG/ACT inhaler, Inhale 1 puff into the lungs 2 (two) times daily., Disp: 12 g, Rfl: 0   guaiFENesin (ROBITUSSIN) 100 MG/5ML liquid, Take 200 mg by mouth 3 (three) times daily as needed for cough., Disp: , Rfl:    mineral oil-hydrophilic petrolatum (AQUAPHOR) ointment, Apply 1 application topically as needed for dry skin (hands)., Disp: , Rfl:    montelukast (SINGULAIR) 10 MG tablet, Take 10 mg by mouth daily., Disp: , Rfl:    PARoxetine (PAXIL) 30 MG tablet, Take 30 mg by mouth daily., Disp: , Rfl:    Medications ordered in this  encounter:  Meds ordered this encounter  Medications   predniSONE  (DELTASONE ) 10 MG tablet    Sig: Days 1-4 take 4 tablets (40 mg) daily  Days 5-8 take 3 tablets (30 mg) daily, Days 9-11 take 2 tablets (20 mg) daily, Days 12-14 take 1 tablet (10 mg) daily.    Dispense:  37 tablet    Refill:  0    Supervising Provider:   BLAISE ALEENE KIDD [[id number]]     *If you need refills on other medications prior to your next appointment, please contact your pharmacy*  Follow-Up: Call back or seek an in-person evaluation if the symptoms worsen or if the condition fails to improve as anticipated.  [facility] [phone]  Other Instructions Asthma, Adult  Asthma is a long-term (chronic) condition that causes recurrent episodes in which the lower airways in the lungs become tight and narrow. The narrowing is caused by inflammation and tightening of the smooth muscle around the lower airways. Asthma episodes, also called asthma attacks or asthma flares, may cause coughing, making high-pitched whistling sounds when you breathe, most often when you breathe out (wheezing), shortness of breath, and chest pain. The airways may produce extra mucus caused by the inflammation and irritation. During an attack, it can be difficult to breathe. Asthma attacks can range from minor to life-threatening. Asthma cannot be cured, but medicines and lifestyle changes can help control it and treat acute attacks. It is  important to keep your asthma well controlled so the condition does not interfere with your daily life. What are the causes? This condition is believed to be caused by inherited (genetic) and environmental factors, but its exact cause is not known. What can trigger an asthma attack? Many things can bring on an asthma attack or make symptoms worse. These triggers are different for every person. Common triggers include: Allergens and irritants like mold, dust, pet dander, cockroaches, pollen, air  pollution, and chemical odors. Cigarette smoke. Weather changes and cold air. Stress and strong emotional responses such as crying or laughing hard. Certain medications such as aspirin or beta blockers. Infections and inflammatory conditions, such as the flu, a cold, pneumonia, or inflammation of the nasal membranes (rhinitis). Gastroesophageal reflux disease (GERD). What are the signs or symptoms? Symptoms may occur right after exposure to an asthma trigger or hours later and can vary by person. Common signs and symptoms include: Wheezing. Trouble breathing (shortness of breath). Excessive nighttime or early morning coughing. Chest tightness. Tiredness (fatigue) with minimal activity. Difficulty talking in complete sentences. Poor exercise tolerance. How is this diagnosed? This condition is diagnosed based on: A physical exam and your medical history. Tests, which may include: Lung function studies to evaluate the flow of air in your lungs. Allergy tests. Imaging tests, such as X-rays. How is this treated? There is no cure, but symptoms can be controlled with proper treatment. Treatment usually involves: Identifying and avoiding your asthma triggers. Inhaled medicines. Two types are commonly used to treat asthma, depending on severity: Controller medicines. These help prevent asthma symptoms from occurring. They are taken every day. Fast-acting reliever or rescue medicines. These quickly relieve asthma symptoms. They are used as needed and provide short-term relief. Using other medicines, such as: Allergy medicines, such as antihistamines, if your asthma attacks are triggered by allergens. Immune medicines (immunomodulators). These are medicines that help control the immune system. Using supplemental oxygen. This is only needed during a severe episode. Creating an asthma action plan. An asthma action plan is a written plan for managing and treating your asthma attacks. This plan  includes: A list of your asthma triggers and how to avoid them. Information about when medicines should be taken and when their dosage should be changed. Instructions about using a device called a peak flow meter. A peak flow meter measures how well the lungs are working and the severity of your asthma. It helps you monitor your condition. Follow these instructions at home: Take over-the-counter and prescription medicines only as told by your health care provider. Stay up to date on all vaccinations as recommended by your healthcare provider, including vaccines for the flu and pneumonia. Use a peak flow meter and keep track of your peak flow readings. Understand and use your asthma action plan to address any asthma flares. Do not smoke or allow anyone to smoke in your home. Contact a health care provider if: You have wheezing, shortness of breath, or a cough that is not responding to medicines. Your medicines are causing side effects, such as a rash, itching, swelling, or trouble breathing. You need to use a reliever medicine more than 2-3 times a week. Your peak flow reading is still at 50-79% of your personal best after following your action plan for 1 hour. You have a fever and shortness of breath. Get help right away if: You are getting worse and do not respond to treatment during an asthma attack. You are short of breath when at  rest or when doing very little physical activity. You have difficulty eating, drinking, or talking. You have chest pain or tightness. You develop a fast heartbeat or palpitations. You have a bluish color to your lips or fingernails. You are light-headed or dizzy, or you faint. Your peak flow reading is less than 50% of your personal best. You feel too tired to breathe normally. These symptoms may be an emergency. Get help right away. Call 911. Do not wait to see if the symptoms will go away. Do not drive yourself to the hospital. Summary Asthma is a  long-term (chronic) condition that causes recurrent episodes in which the airways become tight and narrow. Asthma episodes, also called asthma attacks or asthma flares, can cause coughing, wheezing, shortness of breath, and chest pain. Asthma cannot be cured, but medicines and lifestyle changes can help keep it well controlled and prevent asthma flares. Make sure you understand how to avoid triggers and how and when to use your medicines. Asthma attacks can range from minor to life-threatening. Get help right away if you have an asthma attack and do not respond to treatment with your usual rescue medicines. This information is not intended to replace advice given to you by your health care provider. Make sure you discuss any questions you have with your health care provider. Document Revised: 09/07/2021 Document Reviewed: 08/29/2021 Elsevier Patient Education  2024 Elsevier Inc.   If you have been instructed to have an in-person evaluation today at a local Urgent Care facility, please use the link below. It will take you to a list of all of our available [facility] [place], including address, phone number and hours of operation. Please do not delay care.  [facility]  If you or a family member do not have a primary care provider, use the link below to schedule a visit and establish care. When you choose a [facility] primary care physician or advanced practice provider, you gain a long-term partner in health. Find a Primary Care Provider  Learn more about [facility]'s in-office and virtual care options: [place] - Get Care Now

## 2024-06-16 NOTE — Progress Notes (Signed)
 Virtual Visit Consent   Betty Cannon, you are scheduled for a virtual visit with a [facility] provider today. Just as with appointments in the office, your consent must be obtained to participate. Your consent will be active for this visit and any virtual visit you may have with one of our providers in the next 365 days. If you have a MyChart account, a copy of this consent can be sent to you electronically.  As this is a virtual visit, video technology does not allow for your provider to perform a traditional examination. This may limit your provider's ability to fully assess your condition. If your provider identifies any concerns that need to be evaluated in person or the need to arrange testing (such as labs, EKG, etc.), we will make arrangements to do so. Although advances in technology are sophisticated, we cannot ensure that it will always work on either your end or our end. If the connection with a video visit is poor, the visit may have to be switched to a telephone visit. With either a video or telephone visit, we are not always able to ensure that we have a secure connection.  By engaging in this virtual visit, you consent to the provision of healthcare and authorize for your insurance to be billed (if applicable) for the services provided during this visit. Depending on your insurance coverage, you may receive a charge related to this service.  I need to obtain your verbal consent now. Are you willing to proceed with your visit today? Betty Cannon has provided verbal consent on 06/16/2024 for a virtual visit (video or telephone). Betty [name], PA-C  Date: 06/16/2024 9:08 AM   Virtual Visit via Video Note   I, Betty Cannon, connected with  Betty Cannon  ([id number], 1971/01/29) on 06/16/24 at  9:00 AM EDT by a video-enabled telemedicine application and verified that I am speaking with the correct person using two identifiers.  Location: Patient: Virtual Visit  Location Patient: Home Provider: Virtual Visit Location Provider: Home Office   I discussed the limitations of evaluation and management by telemedicine and the availability of in person appointments. The patient expressed understanding and agreed to proceed.    History of Present Illness: Betty Cannon is a 53 y.o. who identifies as a female who was assigned female at birth, and is being seen today for asthma exacerbation.  HPI: URI  This is a new problem. The current episode started in the past 7 days. The problem has been gradually worsening. There has been no fever. Associated symptoms include chest pain, congestion, coughing, headaches, a sore throat (scratchy) and wheezing. Pertinent negatives include no diarrhea, ear pain, nausea, plugged ear sensation, rhinorrhea, sinus pain or vomiting. She has tried inhaler use (albuterol , nebulizer, dayquil, daily allergy medications) for the symptoms. The treatment provided no relief.   PMH: Asthma    Problems: There are no active problems to display for this patient.   Allergies:  Allergies  Allergen Reactions   Amoxicillin Other (See Comments)    jaundace   Medications:  Current Outpatient Medications:    predniSONE  (DELTASONE ) 10 MG tablet, Days 1-4 take 4 tablets (40 mg) daily  Days 5-8 take 3 tablets (30 mg) daily, Days 9-11 take 2 tablets (20 mg) daily, Days 12-14 take 1 tablet (10 mg) daily., Disp: 37 tablet, Rfl: 0   albuterol  (VENTOLIN  HFA) 108 (90 Base) MCG/ACT inhaler, Inhale 2 puffs into the lungs every 6 (six) hours as needed  for wheezing or shortness of breath., Disp: 8 g, Rfl: 0   benzonatate  (TESSALON ) 100 MG capsule, Take 1 capsule (100 mg total) by mouth 3 (three) times daily as needed., Disp: 30 capsule, Rfl: 0   doxycycline  (VIBRA -TABS) 100 MG tablet, Take 1 tablet (100 mg total) by mouth 2 (two) times daily., Disp: 20 tablet, Rfl: 0   fluticasone  (FLOVENT  HFA) 110 MCG/ACT inhaler, Inhale 1 puff into the lungs 2 (two)  times daily., Disp: 12 g, Rfl: 0   guaiFENesin (ROBITUSSIN) 100 MG/5ML liquid, Take 200 mg by mouth 3 (three) times daily as needed for cough., Disp: , Rfl:    mineral oil-hydrophilic petrolatum (AQUAPHOR) ointment, Apply 1 application topically as needed for dry skin (hands)., Disp: , Rfl:    montelukast (SINGULAIR) 10 MG tablet, Take 10 mg by mouth daily., Disp: , Rfl:    PARoxetine (PAXIL) 30 MG tablet, Take 30 mg by mouth daily., Disp: , Rfl:   Observations/Objective: Patient is well-developed, well-nourished in no acute distress.  Resting comfortably at home.  Head is normocephalic, atraumatic.  No labored breathing.  Speech is clear and coherent with logical content.  Patient is alert and oriented at baseline.    Assessment and Plan: 1. Moderate persistent asthma with exacerbation (Primary) - predniSONE  (DELTASONE ) 10 MG tablet; Days 1-4 take 4 tablets (40 mg) daily  Days 5-8 take 3 tablets (30 mg) daily, Days 9-11 take 2 tablets (20 mg) daily, Days 12-14 take 1 tablet (10 mg) daily.  Dispense: 37 tablet; Refill: 0  - Worsening despite OTC medications and inhaler/nebulizer - Will treat with Prednisone  taper - Can add Mucinex  - Continue inhaler/nebulizer as needed - Continue allergy medications - Push fluids.  - Rest.  - Steam and humidifier can help - Seek in person evaluation if worsening or symptoms fail to improve    Follow Up Instructions: I discussed the assessment and treatment plan with the patient. The patient was provided an opportunity to ask questions and all were answered. The patient agreed with the plan and demonstrated an understanding of the instructions.  A copy of instructions were sent to the patient via MyChart unless otherwise noted below.    The patient was advised to call back or seek an in-person evaluation if the symptoms worsen or if the condition fails to improve as anticipated.    Betty [name], PA-C

## 2024-08-01 ENCOUNTER — Telehealth: Admitting: Physician Assistant

## 2024-08-01 DIAGNOSIS — J019 Acute sinusitis, unspecified: Secondary | ICD-10-CM | POA: Diagnosis not present

## 2024-08-01 DIAGNOSIS — B9689 Other specified bacterial agents as the cause of diseases classified elsewhere: Secondary | ICD-10-CM

## 2024-08-01 DIAGNOSIS — J4541 Moderate persistent asthma with (acute) exacerbation: Secondary | ICD-10-CM | POA: Diagnosis not present

## 2024-08-01 MED ORDER — PSEUDOEPH-BROMPHEN-DM 30-2-10 MG/5ML PO SYRP
5.0000 mL | ORAL_SOLUTION | Freq: Four times a day (QID) | ORAL | 0 refills | Status: AC | PRN
Start: 2024-08-01 — End: ?

## 2024-08-01 MED ORDER — DOXYCYCLINE HYCLATE 100 MG PO TABS
100.0000 mg | ORAL_TABLET | Freq: Two times a day (BID) | ORAL | 0 refills | Status: AC
Start: 2024-08-01 — End: ?

## 2024-08-01 MED ORDER — PREDNISONE 20 MG PO TABS
40.0000 mg | ORAL_TABLET | Freq: Every day | ORAL | 0 refills | Status: AC
Start: 2024-08-01 — End: ?

## 2024-08-01 NOTE — Progress Notes (Signed)
 Virtual Visit Consent   Betty Cannon, you are scheduled for a virtual visit with a [facility] provider today. Just as with appointments in the office, your consent must be obtained to participate. Your consent will be active for this visit and any virtual visit you may have with one of our providers in the next 365 days. If you have a MyChart account, a copy of this consent can be sent to you electronically.  As this is a virtual visit, video technology does not allow for your provider to perform a traditional examination. This may limit your provider's ability to fully assess your condition. If your provider identifies any concerns that need to be evaluated in person or the need to arrange testing (such as labs, EKG, etc.), we will make arrangements to do so. Although advances in technology are sophisticated, we cannot ensure that it will always work on either your end or our end. If the connection with a video visit is poor, the visit may have to be switched to a telephone visit. With either a video or telephone visit, we are not always able to ensure that we have a secure connection.  By engaging in this virtual visit, you consent to the provision of healthcare and authorize for your insurance to be billed (if applicable) for the services provided during this visit. Depending on your insurance coverage, you may receive a charge related to this service.  I need to obtain your verbal consent now. Are you willing to proceed with your visit today? Betty Cannon has provided verbal consent on 08/01/2024 for a virtual visit (video or telephone). [name], PA-C  Date: 08/01/2024 10:14 AM   Virtual Visit via Video Note   I, [name], connected with  Betty Cannon  ([id number], 12/15/1970) on 08/01/24 at 10:00 AM EDT by a video-enabled telemedicine application and verified that I am speaking with the correct person using two identifiers.  Location: Patient: Virtual Visit  Location Patient: Home Provider: Virtual Visit Location Provider: Home Office   I discussed the limitations of evaluation and management by telemedicine and the availability of in person appointments. The patient expressed understanding and agreed to proceed.    History of Present Illness: Betty Cannon is a 53 y.o. who identifies as a female who was assigned female at birth, and is being seen today for URI symptoms with asthma exacerbation.  HPI: URI  This is a new problem. The current episode started 1 to 4 weeks ago. The problem has been unchanged. Maximum temperature: gets cold chills, but no fever known. Associated symptoms include chest pain, congestion, coughing (dry), ear pain (yesterday, now resolved), headaches, neck pain (stiffness more from swollen glands), sinus pain (behind eyes), a sore throat (yesterday; now resolved), swollen glands and wheezing. Pertinent negatives include no diarrhea, nausea, plugged ear sensation, rhinorrhea or vomiting. Associated symptoms comments: Post nasal drainage. She has tried inhaler use, acetaminophen , increased fluids, steam and antihistamine (nebulizer) for the symptoms. The treatment provided no relief.     Problems: There are no active problems to display for this patient.   Allergies:  Allergies  Allergen Reactions   Amoxicillin Other (See Comments)    jaundace   Medications:  Current Outpatient Medications:    brompheniramine-pseudoephedrine-DM 30-2-10 MG/5ML syrup, Take 5 mLs by mouth 4 (four) times daily as needed., Disp: 120 mL, Rfl: 0   doxycycline  (VIBRA -TABS) 100 MG tablet, Take 1 tablet (100 mg total) by mouth 2 (two) times daily.,  Disp: 20 tablet, Rfl: 0   predniSONE  (DELTASONE ) 20 MG tablet, Take 2 tablets (40 mg total) by mouth daily with breakfast., Disp: 14 tablet, Rfl: 0   albuterol  (VENTOLIN  HFA) 108 (90 Base) MCG/ACT inhaler, Inhale 2 puffs into the lungs every 6 (six) hours as needed for wheezing or shortness of breath.,  Disp: 8 g, Rfl: 0   benzonatate  (TESSALON ) 100 MG capsule, Take 1 capsule (100 mg total) by mouth 3 (three) times daily as needed., Disp: 30 capsule, Rfl: 0   fluticasone  (FLOVENT  HFA) 110 MCG/ACT inhaler, Inhale 1 puff into the lungs 2 (two) times daily., Disp: 12 g, Rfl: 0   guaiFENesin (ROBITUSSIN) 100 MG/5ML liquid, Take 200 mg by mouth 3 (three) times daily as needed for cough., Disp: , Rfl:    mineral oil-hydrophilic petrolatum (AQUAPHOR) ointment, Apply 1 application topically as needed for dry skin (hands)., Disp: , Rfl:    montelukast (SINGULAIR) 10 MG tablet, Take 10 mg by mouth daily., Disp: , Rfl:    PARoxetine (PAXIL) 30 MG tablet, Take 30 mg by mouth daily., Disp: , Rfl:   Observations/Objective: Patient is well-developed, well-nourished in no acute distress.  Resting comfortably at home.  Head is normocephalic, atraumatic.  No labored breathing.  Speech is clear and coherent with logical content.  Patient is alert and oriented at baseline.    Assessment and Plan: 1. Acute bacterial sinusitis (Primary) - doxycycline  (VIBRA -TABS) 100 MG tablet; Take 1 tablet (100 mg total) by mouth 2 (two) times daily.  Dispense: 20 tablet; Refill: 0 - brompheniramine-pseudoephedrine-DM 30-2-10 MG/5ML syrup; Take 5 mLs by mouth 4 (four) times daily as needed.  Dispense: 120 mL; Refill: 0  2. Moderate persistent asthma with exacerbation - doxycycline  (VIBRA -TABS) 100 MG tablet; Take 1 tablet (100 mg total) by mouth 2 (two) times daily.  Dispense: 20 tablet; Refill: 0 - predniSONE  (DELTASONE ) 20 MG tablet; Take 2 tablets (40 mg total) by mouth daily with breakfast.  Dispense: 14 tablet; Refill: 0 - brompheniramine-pseudoephedrine-DM 30-2-10 MG/5ML syrup; Take 5 mLs by mouth 4 (four) times daily as needed.  Dispense: 120 mL; Refill: 0  - Worsening symptoms that have not responded to OTC medications.  - Will give Doxycycline  and Prednisone  - Bromfed DM for cough - Continue allergy medications.   - Steam and humidifier can help - Stay well hydrated and get plenty of rest.  - Seek in person evaluation if no symptom improvement or if symptoms worsen   Follow Up Instructions: I discussed the assessment and treatment plan with the patient. The patient was provided an opportunity to ask questions and all were answered. The patient agreed with the plan and demonstrated an understanding of the instructions.  A copy of instructions were sent to the patient via MyChart unless otherwise noted below.    The patient was advised to call back or seek an in-person evaluation if the symptoms worsen or if the condition fails to improve as anticipated.    [name], PA-C

## 2024-08-01 NOTE — Patient Instructions (Signed)
 Betty Cannon, thank you for joining Delon Betty Dickinson, PA-C for today's virtual visit.  While this provider is not your primary care provider (PCP), if your PCP is located in our provider database this encounter information will be shared with them immediately following your visit.   A [facility] MyChart account gives you access to today's visit and all your visits, tests, and labs performed at [facility]  click here if you don't have a [facility] MyChart account or go to [email or web address]  Consent: (Patient) Betty Cannon provided verbal consent for this virtual visit at the beginning of the encounter.  Current Medications:  Current Outpatient Medications:    brompheniramine-pseudoephedrine-DM 30-2-10 MG/5ML syrup, Take 5 mLs by mouth 4 (four) times daily as needed., Disp: 120 mL, Rfl: 0   doxycycline  (VIBRA -TABS) 100 MG tablet, Take 1 tablet (100 mg total) by mouth 2 (two) times daily., Disp: 20 tablet, Rfl: 0   predniSONE  (DELTASONE ) 20 MG tablet, Take 2 tablets (40 mg total) by mouth daily with breakfast., Disp: 14 tablet, Rfl: 0   albuterol  (VENTOLIN  HFA) 108 (90 Base) MCG/ACT inhaler, Inhale 2 puffs into the lungs every 6 (six) hours as needed for wheezing or shortness of breath., Disp: 8 g, Rfl: 0   benzonatate  (TESSALON ) 100 MG capsule, Take 1 capsule (100 mg total) by mouth 3 (three) times daily as needed., Disp: 30 capsule, Rfl: 0   fluticasone  (FLOVENT  HFA) 110 MCG/ACT inhaler, Inhale 1 puff into the lungs 2 (two) times daily., Disp: 12 g, Rfl: 0   guaiFENesin (ROBITUSSIN) 100 MG/5ML liquid, Take 200 mg by mouth 3 (three) times daily as needed for cough., Disp: , Rfl:    mineral oil-hydrophilic petrolatum (AQUAPHOR) ointment, Apply 1 application topically as needed for dry skin (hands)., Disp: , Rfl:    montelukast (SINGULAIR) 10 MG tablet, Take 10 mg by mouth daily., Disp: , Rfl:    PARoxetine (PAXIL) 30 MG tablet, Take 30 mg by mouth daily., Disp: ,  Rfl:    Medications ordered in this encounter:  Meds ordered this encounter  Medications   doxycycline  (VIBRA -TABS) 100 MG tablet    Sig: Take 1 tablet (100 mg total) by mouth 2 (two) times daily.    Dispense:  20 tablet    Refill:  0    Supervising Provider:   LAMPTEY, PHILIP O [[id number]]   predniSONE  (DELTASONE ) 20 MG tablet    Sig: Take 2 tablets (40 mg total) by mouth daily with breakfast.    Dispense:  14 tablet    Refill:  0    Supervising Provider:   LAMPTEY, PHILIP O [[id number]]   brompheniramine-pseudoephedrine-DM 30-2-10 MG/5ML syrup    Sig: Take 5 mLs by mouth 4 (four) times daily as needed.    Dispense:  120 mL    Refill:  0    Supervising Provider:   BLAISE ALEENE KIDD [[id number]]     *If you need refills on other medications prior to your next appointment, please contact your pharmacy*  Follow-Up: Call back or seek an in-person evaluation if the symptoms worsen or if the condition fails to improve as anticipated.  [facility] [phone]  Other Instructions Upper Respiratory Infection, Adult An upper respiratory infection (URI) is a common viral infection of the nose, throat, and upper air passages that lead to the lungs. The most common type of URI is the common cold. URIs usually get better on their own, without medical treatment. What are the causes?  A URI is caused by a virus. You may catch a virus by: Breathing in droplets from an infected person's cough or sneeze. Touching something that has been exposed to the virus (is contaminated) and then touching your mouth, nose, or eyes. What increases the risk? You are more likely to get a URI if: You are very [place] or very old. You have close contact with others, such as at work, school, or a health care facility. You smoke. You have long-term (chronic) heart or lung disease. You have a weakened disease-fighting system (immune system). You have nasal allergies or asthma. You are experiencing a lot of  stress. You have poor nutrition. What are the signs or symptoms? A URI usually involves some of the following symptoms: Runny or stuffy (congested) nose. Cough. Sneezing. Sore throat. Headache. Fatigue. Fever. Loss of appetite. Pain in your forehead, behind your eyes, and over your cheekbones (sinus pain). Muscle aches. Redness or irritation of the eyes. Pressure in the ears or face. How is this diagnosed? This condition may be diagnosed based on your medical history and symptoms, and a physical exam. Your health care provider may use a swab to take a mucus sample from your nose (nasal swab). This sample can be tested to determine what virus is causing the illness. How is this treated? URIs usually get better on their own within 7-10 days. Medicines cannot cure URIs, but your health care provider may recommend certain medicines to help relieve symptoms, such as: Over-the-counter cold medicines. Cough suppressants. Coughing is a type of defense against infection that helps to clear the respiratory system, so take these medicines only as recommended by your health care provider. Fever-reducing medicines. Follow these instructions at home: Activity Rest as needed. If you have a fever, stay home from work or school until your fever is gone or until your health care provider says your URI cannot spread to other people (is no longer contagious). Your health care provider may have you wear a face mask to prevent your infection from spreading. Relieving symptoms Gargle with a mixture of Cannon and water 3-4 times a day or as needed. To make Cannon water, completely dissolve -1 tsp (3-6 g) of Cannon in 1 cup (237 mL) of warm water. Use a cool-mist humidifier to add moisture to the air. This can help you breathe more easily. Eating and drinking  Drink enough fluid to keep your urine pale yellow. Eat soups and other clear broths. General instructions  Take over-the-counter and prescription  medicines only as told by your health care provider. These include cold medicines, fever reducers, and cough suppressants. Do not use any products that contain nicotine or tobacco. These products include cigarettes, chewing tobacco, and vaping devices, such as e-cigarettes. If you need help quitting, ask your health care provider. Stay away from secondhand smoke. Stay up to date on all immunizations, including the yearly (annual) flu vaccine. Keep all follow-up visits. This is important. How to prevent the spread of infection to others URIs can be contagious. To prevent the infection from spreading: Wash your hands with soap and water for at least 20 seconds. If soap and water are not available, use hand sanitizer. Avoid touching your mouth, face, eyes, or nose. Cough or sneeze into a tissue or your sleeve or elbow instead of into your hand or into the air.  Contact a health care provider if: You are getting worse instead of better. You have a fever or chills. Your mucus is brown or red.  You have yellow or brown discharge coming from your nose. You have pain in your face, especially when you bend forward. You have swollen neck glands. You have pain while swallowing. You have white areas in the back of your throat. Get help right away if: You have shortness of breath that gets worse. You have severe or persistent: Headache. Ear pain. Sinus pain. Chest pain. You have chronic lung disease along with any of the following: Making high-pitched whistling sounds when you breathe, most often when you breathe out (wheezing). Prolonged cough (more than 14 days). Coughing up blood. A change in your usual mucus. You have a stiff neck. You have changes in your: Vision. Hearing. Thinking. Mood. These symptoms may be an emergency. Get help right away. Call 911. Do not wait to see if the symptoms will go away. Do not drive yourself to the hospital. Summary An upper respiratory infection  (URI) is a common infection of the nose, throat, and upper air passages that lead to the lungs. A URI is caused by a virus. URIs usually get better on their own within 7-10 days. Medicines cannot cure URIs, but your health care provider may recommend certain medicines to help relieve symptoms. This information is not intended to replace advice given to you by your health care provider. Make sure you discuss any questions you have with your health care provider. Document Revised: 06/22/2021 Document Reviewed: 06/22/2021 Elsevier Patient Education  2024 Elsevier Inc.   If you have been instructed to have an in-person evaluation today at a local Urgent Care facility, please use the link below. It will take you to a list of all of our available [facility], including address, phone number and hours of operation. Please do not delay care.  [facility]  If you or a family member do not have a primary care provider, use the link below to schedule a visit and establish care. When you choose a [facility] primary care physician or advanced practice provider, you gain a long-term partner in health. Find a Primary Care Provider  Learn more about [facility] in-office and virtual care options: [facility] Get Care Now

## 2024-08-28 ENCOUNTER — Telehealth: Admitting: Physician Assistant

## 2024-08-28 DIAGNOSIS — B360 Pityriasis versicolor: Secondary | ICD-10-CM | POA: Diagnosis not present

## 2024-08-28 MED ORDER — KETOCONAZOLE 2 % EX SHAM
1.0000 | MEDICATED_SHAMPOO | CUTANEOUS | 0 refills | Status: AC
Start: 2024-08-28 — End: ?

## 2024-08-28 NOTE — Patient Instructions (Signed)
 Betty Cannon, thank you for joining Delon Betty Dickinson, PA-C for today's virtual visit.  While this provider is not your primary care provider (PCP), if your PCP is located in our provider database this encounter information will be shared with them immediately following your visit.   A [facility] MyChart account gives you access to today's visit and all your visits, tests, and labs performed at [facility]  click here if you don't have a [facility] MyChart account or go to [email or web address]  Consent: (Patient) Betty Cannon provided verbal consent for this virtual visit at the beginning of the encounter.  Current Medications:  Current Outpatient Medications:    ketoconazole  (NIZORAL ) 2 % shampoo, Apply 1 Application topically 2 (two) times a week., Disp: 240 mL, Rfl: 0   albuterol  (VENTOLIN  HFA) 108 (90 Base) MCG/ACT inhaler, Inhale 2 puffs into the lungs every 6 (six) hours as needed for wheezing or shortness of breath., Disp: 8 g, Rfl: 0   benzonatate  (TESSALON ) 100 MG capsule, Take 1 capsule (100 mg total) by mouth 3 (three) times daily as needed., Disp: 30 capsule, Rfl: 0   brompheniramine-pseudoephedrine-DM 30-2-10 MG/5ML syrup, Take 5 mLs by mouth 4 (four) times daily as needed., Disp: 120 mL, Rfl: 0   doxycycline  (VIBRA -TABS) 100 MG tablet, Take 1 tablet (100 mg total) by mouth 2 (two) times daily., Disp: 20 tablet, Rfl: 0   fluticasone  (FLOVENT  HFA) 110 MCG/ACT inhaler, Inhale 1 puff into the lungs 2 (two) times daily., Disp: 12 g, Rfl: 0   guaiFENesin (ROBITUSSIN) 100 MG/5ML liquid, Take 200 mg by mouth 3 (three) times daily as needed for cough., Disp: , Rfl:    mineral oil-hydrophilic petrolatum (AQUAPHOR) ointment, Apply 1 application topically as needed for dry skin (hands)., Disp: , Rfl:    montelukast (SINGULAIR) 10 MG tablet, Take 10 mg by mouth daily., Disp: , Rfl:    PARoxetine (PAXIL) 30 MG tablet, Take 30 mg by mouth daily., Disp: , Rfl:     predniSONE  (DELTASONE ) 20 MG tablet, Take 2 tablets (40 mg total) by mouth daily with breakfast., Disp: 14 tablet, Rfl: 0   Medications ordered in this encounter:  Meds ordered this encounter  Medications   ketoconazole  (NIZORAL ) 2 % shampoo    Sig: Apply 1 Application topically 2 (two) times a week.    Dispense:  240 mL    Refill:  0    Supervising Provider:   BLAISE ALEENE KIDD [[id number]]     *If you need refills on other medications prior to your next appointment, please contact your pharmacy*  Follow-Up: Call back or seek an in-person evaluation if the symptoms worsen or if the condition fails to improve as anticipated.  [facility] [phone]  Other Instructions  Tinea Versicolor  Tinea versicolor is a skin infection. It is caused by a type of yeast. It is normal for some yeast to be on your skin, but too much yeast causes this infection. The infection causes a rash of light or dark patches on your skin. The rash is most common on the chest, back, neck, or upper arms. The infection usually does not cause other problems. If it is treated, it will probably go away in a few weeks. The infection cannot be spread from one person to another (is notcontagious). What are the causes? This condition is caused by a certain type of yeast that starts to grow too much on your skin. What increases the risk? Heat and  humidity. Sweating too much. Hormone changes. This may happen when taking birth control pills. Oily skin. A weak disease-fighting system (immunesystem). What are the signs or symptoms? A rash of light or dark patches on your skin. The rash may have: Patches of tan or pink spots (on light skin). Patches of white or brown spots (on dark skin). Patches of skin that do not tan. Well-marked edges. Scales. Mild itching. There may also be no itching. How is this treated? Treatment for this condition may include: Dandruff shampoo. The shampoo may be used on the  affected skin during showers or baths. Over-the-counter medicated skin cream, lotion, or soaps. Prescription antifungal medicine. This may include cream or pills. Medicine to help your itching. Follow these instructions at home: Use over-the-counter and prescription medicines only as told by your doctor. Wash your skin with dandruff shampoo as told by your doctor. Do not scratch your skin in the rash area. Avoid places that are hot and humid. Do not use tanning booths. Try to avoid sweating a lot. Contact a doctor if: Your symptoms get worse. You have a fever. You have signs of infection such as: Redness, swelling, or pain in the rash area. Warmth coming from your rash. Fluid or blood coming from your rash. Pus or a bad smell coming from your rash. Your rash comes back (recurs) after treatment. Your rash does not improve with treatment. Your rash spreads to other parts of the body. Summary Tinea versicolor is a skin infection. It causes a rash of light or dark patches on your skin. The rash is most common on the chest, back, neck, or upper arms. This infection usually does not cause other problems. Use over-the-counter and prescription medicines only as told by your doctor. If the infection is treated, it will probably go away in a few weeks. This information is not intended to replace advice given to you by your health care provider. Make sure you discuss any questions you have with your health care provider. Document Revised: 02/08/2021 Document Reviewed: 02/08/2021 Elsevier Patient Education  2024 Elsevier Inc.   If you have been instructed to have an in-person evaluation today at a local Urgent Care facility, please use the link below. It will take you to a list of all of our available [facility], including address, phone number and hours of operation. Please do not delay care.  [facility]  If you or a family member do not have a primary care  provider, use the link below to schedule a visit and establish care. When you choose a [facility] primary care physician or advanced practice provider, you gain a long-term partner in health. Find a Primary Care Provider  Learn more about [facility] in-office and virtual care options: [facility] - Get Care Now

## 2024-08-28 NOTE — Progress Notes (Signed)
 Virtual Visit Consent   Betty Cannon, you are scheduled for a virtual visit with a [facility] provider today. Just as with appointments in the office, your consent must be obtained to participate. Your consent will be active for this visit and any virtual visit you may have with one of our providers in the next 365 days. If you have a MyChart account, a copy of this consent can be sent to you electronically.  As this is a virtual visit, video technology does not allow for your provider to perform a traditional examination. This may limit your provider's ability to fully assess your condition. If your provider identifies any concerns that need to be evaluated in person or the need to arrange testing (such as labs, EKG, etc.), we will make arrangements to do so. Although advances in technology are sophisticated, we cannot ensure that it will always work on either your end or our end. If the connection with a video visit is poor, the visit may have to be switched to a telephone visit. With either a video or telephone visit, we are not always able to ensure that we have a secure connection.  By engaging in this virtual visit, you consent to the provision of healthcare and authorize for your insurance to be billed (if applicable) for the services provided during this visit. Depending on your insurance coverage, you may receive a charge related to this service.  I need to obtain your verbal consent now. Are you willing to proceed with your visit today? Betty Cannon has provided verbal consent on 08/28/2024 for a virtual visit (video or telephone). [name], PA-C  Date: 08/28/2024 5:32 PM   Virtual Visit via Video Note   I, [name], connected with  Betty Cannon  ([id number], May 22, 1971) on 08/28/24 at  5:15 PM EDT by a video-enabled telemedicine application and verified that I am speaking with the correct person using two identifiers.  Location: Patient: Virtual Visit  Location Patient: Home Provider: Virtual Visit Location Provider: Home Office   I discussed the limitations of evaluation and management by telemedicine and the availability of in person appointments. The patient expressed understanding and agreed to proceed.    History of Present Illness: Betty Cannon is a 53 y.o. who identifies as a female who was assigned female at birth, and is being seen today for discoloration of forearms. Has had these brown, flaky lesions with irregular shaped borders on bilateral forearms for over a year. Denies itching or pain. Denies drainage. Does not feel they are consistent with sunspots like she has on her hand.   Problems: There are no active problems to display for this patient.   Allergies:  Allergies  Allergen Reactions   Amoxicillin Other (See Comments)    jaundace   Medications:  Current Outpatient Medications:    ketoconazole  (NIZORAL ) 2 % shampoo, Apply 1 Application topically 2 (two) times a week., Disp: 240 mL, Rfl: 0   albuterol  (VENTOLIN  HFA) 108 (90 Base) MCG/ACT inhaler, Inhale 2 puffs into the lungs every 6 (six) hours as needed for wheezing or shortness of breath., Disp: 8 g, Rfl: 0   benzonatate  (TESSALON ) 100 MG capsule, Take 1 capsule (100 mg total) by mouth 3 (three) times daily as needed., Disp: 30 capsule, Rfl: 0   brompheniramine-pseudoephedrine-DM 30-2-10 MG/5ML syrup, Take 5 mLs by mouth 4 (four) times daily as needed., Disp: 120 mL, Rfl: 0   doxycycline  (VIBRA -TABS) 100 MG tablet, Take 1 tablet (  100 mg total) by mouth 2 (two) times daily., Disp: 20 tablet, Rfl: 0   fluticasone  (FLOVENT  HFA) 110 MCG/ACT inhaler, Inhale 1 puff into the lungs 2 (two) times daily., Disp: 12 g, Rfl: 0   guaiFENesin (ROBITUSSIN) 100 MG/5ML liquid, Take 200 mg by mouth 3 (three) times daily as needed for cough., Disp: , Rfl:    mineral oil-hydrophilic petrolatum (AQUAPHOR) ointment, Apply 1 application topically as needed for dry skin (hands)., Disp: ,  Rfl:    montelukast (SINGULAIR) 10 MG tablet, Take 10 mg by mouth daily., Disp: , Rfl:    PARoxetine (PAXIL) 30 MG tablet, Take 30 mg by mouth daily., Disp: , Rfl:    predniSONE  (DELTASONE ) 20 MG tablet, Take 2 tablets (40 mg total) by mouth daily with breakfast., Disp: 14 tablet, Rfl: 0  Observations/Objective: Patient is well-developed, well-nourished in no acute distress.  Resting comfortably at home.  Head is normocephalic, atraumatic.  No labored breathing.  Speech is clear and coherent with logical content.  Patient is alert and oriented at baseline.  Irregular, flaky bordered tan lesions on dorsal forearms bilaterally   Assessment and Plan: 1. Tinea versicolor (Primary) - ketoconazole  (NIZORAL ) 2 % shampoo; Apply 1 Application topically 2 (two) times a week.  Dispense: 240 mL; Refill: 0  - Possible mild tinea versicolor, but advised hard to differentiate through video - Will trial Ketoconazole  shampoo, wash arms twice weekly using this instead of body wash - Moisturize skin well - Follow up with PCP if not improving or if worsening  Follow Up Instructions: I discussed the assessment and treatment plan with the patient. The patient was provided an opportunity to ask questions and all were answered. The patient agreed with the plan and demonstrated an understanding of the instructions.  A copy of instructions were sent to the patient via MyChart unless otherwise noted below.    The patient was advised to call back or seek an in-person evaluation if the symptoms worsen or if the condition fails to improve as anticipated.    [name], PA-C
